# Patient Record
Sex: Male | Born: 1985 | Race: White | Hispanic: No | Marital: Married | Smoking: Never smoker
Health system: Southern US, Community
[De-identification: ages and names within clinical notes are randomized; demographics above are authoritative.]

## PROBLEM LIST (undated history)

## (undated) DIAGNOSIS — T7840XA Allergy, unspecified, initial encounter: Secondary | ICD-10-CM

## (undated) DIAGNOSIS — G4730 Sleep apnea, unspecified: Secondary | ICD-10-CM

## (undated) HISTORY — DX: Sleep apnea, unspecified: G47.30

## (undated) HISTORY — DX: Allergy, unspecified, initial encounter: T78.40XA

---

## 2009-08-24 ENCOUNTER — Ambulatory Visit: Payer: Self-pay | Admitting: Family Medicine

## 2013-09-26 ENCOUNTER — Ambulatory Visit: Payer: Self-pay | Admitting: Otolaryngology

## 2014-04-04 DIAGNOSIS — G4733 Obstructive sleep apnea (adult) (pediatric): Secondary | ICD-10-CM | POA: Insufficient documentation

## 2015-08-05 ENCOUNTER — Ambulatory Visit (INDEPENDENT_AMBULATORY_CARE_PROVIDER_SITE_OTHER): Payer: BLUE CROSS/BLUE SHIELD | Admitting: Family Medicine

## 2015-08-05 ENCOUNTER — Encounter: Payer: Self-pay | Admitting: Family Medicine

## 2015-08-05 VITALS — BP 120/70 | HR 88 | Temp 98.2°F | Ht 74.0 in | Wt 218.0 lb

## 2015-08-05 DIAGNOSIS — J0100 Acute maxillary sinusitis, unspecified: Secondary | ICD-10-CM | POA: Diagnosis not present

## 2015-08-05 MED ORDER — AMOXICILLIN 500 MG PO CAPS: 500.0000 mg | ORAL_CAPSULE | Freq: Three times a day (TID) | ORAL | Status: DC

## 2015-08-05 NOTE — Patient Instructions (Signed)

## 2015-08-05 NOTE — Progress Notes (Signed)
Name: Ralph Ashley   MRN: [id number]    DOB: 1986-01-04   Date:08/05/2015       Progress Note  Subjective  Chief Complaint  Chief Complaint  Patient presents with  . Sinusitis    cong, nasal drainage, sore throat    Sinusitis This is a new problem. The current episode started today. The problem has been gradually worsening since onset. The maximum temperature recorded prior to his arrival was 100.4 - 100.9 F. Associated symptoms include chills, congestion, coughing, diaphoresis, headaches, sinus pressure, sneezing and a sore throat. Pertinent negatives include no ear pain, neck pain, shortness of breath or swollen glands. Treatments tried: nasocort. The treatment provided mild relief.    No problem-specific assessment & plan notes found for this encounter.   Past Medical History  Diagnosis Date  . Allergy   . Sleep apnea     uses cpap    History reviewed. No pertinent past surgical history.  Family History  Problem Relation Age of Onset  . Cancer Paternal Grandfather   . Stroke Paternal Grandfather     Social History   Social History  . Marital Status: Married    Spouse Name: N/A  . Number of Children: N/A  . Years of Education: N/A   Occupational History  . Not on file.   Social History Main Topics  . Smoking status: Never Smoker   . Smokeless tobacco: Not on file  . Alcohol Use: 0.0 oz/week    0 Standard drinks or equivalent per week  . Drug Use: No  . Sexual Activity: Yes   Other Topics Concern  . Not on file   Social History Narrative  . No narrative on file    Allergies  Allergen Reactions  . Sulfa Antibiotics      Review of Systems  Constitutional: Positive for chills and diaphoresis. Negative for fever, weight loss and malaise/fatigue.  HENT: Positive for congestion, sinus pressure, sneezing and sore throat. Negative for ear discharge and ear pain.   Eyes: Negative for blurred vision.  Respiratory: Positive for cough. Negative for sputum  production, shortness of breath and wheezing.   Cardiovascular: Negative for chest pain, palpitations and leg swelling.  Gastrointestinal: Negative for heartburn, nausea, abdominal pain, diarrhea, constipation, blood in stool and melena.  Genitourinary: Negative for dysuria, urgency, frequency and hematuria.  Musculoskeletal: Negative for myalgias, back pain, joint pain and neck pain.  Skin: Negative for rash.  Neurological: Positive for headaches. Negative for dizziness, tingling, sensory change and focal weakness.  Endo/Heme/Allergies: Negative for environmental allergies and polydipsia. Does not bruise/bleed easily.  Psychiatric/Behavioral: Negative for depression and suicidal ideas. The patient is not nervous/anxious and does not have insomnia.      Objective  Filed Vitals:   08/05/15 1032  BP: 120/70  Pulse: 88  Temp: 98.2 F (36.8 C)  TempSrc: Oral  Height:  (1.88 m)  Weight: 218 lb (98.884 kg)    Physical Exam  Constitutional: He is oriented to person, place, and time and well-developed, well-nourished, and in no distress.  HENT:  Head: Normocephalic.  Right Ear: External ear normal.  Left Ear: External ear normal.  Nose: Nose normal.  Mouth/Throat: Oropharynx is clear and moist.  Eyes: Conjunctivae and EOM are normal. Pupils are equal, round, and reactive to light. Right eye exhibits no discharge. Left eye exhibits no discharge. No scleral icterus.  Neck: Normal range of motion. Neck supple. No JVD present. No tracheal deviation present. No thyromegaly present.  Cardiovascular:  Normal rate, regular rhythm, normal heart sounds and intact distal pulses.  Exam reveals no gallop and no friction rub.   No murmur heard. Pulmonary/Chest: Breath sounds normal. No respiratory distress. He has no wheezes. He has no rales.  Abdominal: Soft. Bowel sounds are normal. He exhibits no mass. There is no hepatosplenomegaly. There is no tenderness. There is no rebound, no guarding and  no CVA tenderness.  Musculoskeletal: Normal range of motion. He exhibits no edema or tenderness.  Lymphadenopathy:    He has no cervical adenopathy.  Neurological: He is alert and oriented to person, place, and time. He has normal sensation, normal strength and intact cranial nerves. No cranial nerve deficit.  Skin: Skin is warm. No rash noted.  Psychiatric: Mood and affect normal.      Assessment & Plan  Problem List Items Addressed This Visit    None    Visit Diagnoses    Acute maxillary sinusitis, recurrence not specified    -  Primary    Relevant Medications    Triamcinolone Acetonide (NASACORT ALLERGY 24HR NA)    amoxicillin (AMOXIL) 500 MG capsule         Dr. [facility]  08/05/2015

## 2015-09-29 ENCOUNTER — Ambulatory Visit (INDEPENDENT_AMBULATORY_CARE_PROVIDER_SITE_OTHER): Payer: BLUE CROSS/BLUE SHIELD | Admitting: Family Medicine

## 2015-09-29 ENCOUNTER — Encounter: Payer: Self-pay | Admitting: Family Medicine

## 2015-09-29 VITALS — BP 110/60 | HR 86 | Ht 74.0 in | Wt 219.0 lb

## 2015-09-29 DIAGNOSIS — Z0000 Encounter for general adult medical examination without abnormal findings: Secondary | ICD-10-CM | POA: Diagnosis not present

## 2015-09-29 DIAGNOSIS — Z1211 Encounter for screening for malignant neoplasm of colon: Secondary | ICD-10-CM

## 2015-09-29 LAB — POCT URINALYSIS DIPSTICK
BILIRUBIN UA: NEGATIVE
Blood, UA: NEGATIVE
CLARITY UA: NEGATIVE
Glucose, UA: NEGATIVE
KETONES UA: NEGATIVE
LEUKOCYTES UA: NEGATIVE
Nitrite, UA: NEGATIVE
Protein, UA: NEGATIVE
SPEC GRAV UA: 1.02
Urobilinogen, UA: 0.2
pH, UA: 6

## 2015-09-29 LAB — HEMOCCULT GUIAC POC 1CARD (OFFICE): Fecal Occult Blood, POC: NEGATIVE

## 2015-09-29 NOTE — Progress Notes (Signed)
Name: Ralph Ashley   MRN: [id number]    DOB: [birth year]   Date:09/29/2015       Progress Note  Subjective  Chief Complaint  Chief Complaint  Patient presents with  . Annual Exam    no concerns    HPI Comments: Patient presents for annual physical exam.   No problem-specific assessment & plan notes found for this encounter.   Past Medical History  Diagnosis Date  . Allergy   . Sleep apnea     uses cpap    History reviewed. No pertinent past surgical history.  Family History  Problem Relation Age of Onset  . Cancer Paternal Grandfather   . Stroke Paternal Grandfather     Social History   Social History  . Marital Status: Married    Spouse Name: N/A  . Number of Children: N/A  . Years of Education: N/A   Occupational History  . Not on file.   Social History Main Topics  . Smoking status: Never Smoker   . Smokeless tobacco: Not on file  . Alcohol Use: 0.0 oz/week    0 Standard drinks or equivalent per week  . Drug Use: No  . Sexual Activity: Yes   Other Topics Concern  . Not on file   Social History Narrative    Allergies  Allergen Reactions  . Sulfa Antibiotics      Review of Systems  Constitutional: Negative for fever, chills, weight loss and malaise/fatigue.  HENT: Negative for ear discharge, ear pain and sore throat.   Eyes: Negative for blurred vision.  Respiratory: Negative for cough, sputum production, shortness of breath and wheezing.   Cardiovascular: Negative for chest pain, palpitations and leg swelling.  Gastrointestinal: Negative for heartburn, nausea, abdominal pain, diarrhea, constipation, blood in stool and melena.  Genitourinary: Negative for dysuria, urgency, frequency and hematuria.  Musculoskeletal: Negative for myalgias, back pain, joint pain and neck pain.  Skin: Negative for rash.  Neurological: Negative for dizziness, tingling, sensory change, focal weakness and headaches.  Endo/Heme/Allergies: Negative for  environmental allergies and polydipsia. Does not bruise/bleed easily.  Psychiatric/Behavioral: Negative for depression and suicidal ideas. The patient is not nervous/anxious and does not have insomnia.      Objective  Filed Vitals:   09/29/15 0839  BP: 110/60  Pulse: 86  Height: 6\' 2"  (1.88 m)  Weight: 219 lb (99.338 kg)    Physical Exam  Constitutional: He is oriented to person, place, and time and well-developed, well-nourished, and in no distress.  HENT:  Head: Normocephalic.  Right Ear: Tympanic membrane, external ear and ear canal normal. No decreased hearing is noted.  Left Ear: Tympanic membrane, external ear and ear canal normal. No decreased hearing is noted.  Nose: Nose normal. No mucosal edema.  Mouth/Throat: Uvula is midline, oropharynx is clear and moist and mucous membranes are normal.  Eyes: Conjunctivae and EOM are normal. Pupils are equal, round, and reactive to light. Right eye exhibits no discharge. Left eye exhibits no discharge. No scleral icterus.  Neck: Normal range of motion. Neck supple. No JVD present. No tracheal deviation present. No thyromegaly present.  Cardiovascular: Normal rate, regular rhythm, normal heart sounds and intact distal pulses.  Exam reveals no gallop and no friction rub.   No murmur heard. Pulmonary/Chest: Breath sounds normal. No respiratory distress. He has no wheezes. He has no rales.  Abdominal: Soft. Bowel sounds are normal. He exhibits no mass. There is no hepatosplenomegaly. There is no tenderness. There is no rebound,  no guarding and no CVA tenderness.  Genitourinary: Rectum normal, prostate normal, testes/scrotum normal and penis normal. Guaiac negative stool.  Musculoskeletal: Normal range of motion. He exhibits no edema or tenderness.  Lymphadenopathy:    He has no cervical adenopathy.  Neurological: He is alert and oriented to person, place, and time. He has normal sensation, normal strength, normal reflexes and intact cranial  nerves. No cranial nerve deficit.  Skin: Skin is warm and dry. No rash noted.  Psychiatric: Mood and affect normal.  Nursing note and vitals reviewed.     Assessment & Plan  Problem List Items Addressed This Visit      Other   Annual physical exam - Primary   Relevant Orders   POCT Occult Blood Stool (Completed)   POCT urinalysis dipstick (Completed)   Renal Function Panel   Lipid Profile    Other Visit Diagnoses    Colon cancer screening             Dr. Elizabeth Sauer [facility]  09/29/2015

## 2015-09-30 LAB — RENAL FUNCTION PANEL
ALBUMIN: 4.6 g/dL (ref 3.5–5.5)
BUN/Creatinine Ratio: 13 (ref 9–20)
BUN: 12 mg/dL (ref 6–20)
CALCIUM: 9.5 mg/dL (ref 8.7–10.2)
CHLORIDE: 103 mmol/L (ref 96–106)
CO2: 24 mmol/L (ref 18–29)
Creatinine, Ser: 0.95 mg/dL (ref 0.76–1.27)
GFR calc Af Amer: 123 mL/min/{1.73_m2} (ref >59)
GFR calc non Af Amer: 106 mL/min/{1.73_m2} (ref >59)
GLUCOSE: 81 mg/dL (ref 65–99)
PHOSPHORUS: 3.2 mg/dL (ref 2.5–4.5)
POTASSIUM: 4.3 mmol/L (ref 3.5–5.2)
SODIUM: 146 mmol/L — AB (ref 134–144)

## 2015-09-30 LAB — LIPID PANEL
CHOLESTEROL TOTAL: 166 mg/dL (ref 100–199)
Chol/HDL Ratio: 4 ratio units (ref 0.0–5.0)
HDL: 41 mg/dL (ref >39)
LDL Calculated: 92 mg/dL (ref 0–99)
TRIGLYCERIDES: 163 mg/dL — AB (ref 0–149)
VLDL Cholesterol Cal: 33 mg/dL (ref 5–40)

## 2016-06-06 DIAGNOSIS — G4733 Obstructive sleep apnea (adult) (pediatric): Secondary | ICD-10-CM | POA: Diagnosis not present

## 2016-10-28 ENCOUNTER — Ambulatory Visit (INDEPENDENT_AMBULATORY_CARE_PROVIDER_SITE_OTHER): Payer: BLUE CROSS/BLUE SHIELD | Admitting: Family Medicine

## 2016-10-28 ENCOUNTER — Encounter: Payer: Self-pay | Admitting: Family Medicine

## 2016-10-28 VITALS — BP 100/64 | HR 72 | Ht 74.0 in | Wt 224.0 lb

## 2016-10-28 DIAGNOSIS — Z0000 Encounter for general adult medical examination without abnormal findings: Secondary | ICD-10-CM | POA: Diagnosis not present

## 2016-10-28 DIAGNOSIS — Z23 Encounter for immunization: Secondary | ICD-10-CM | POA: Diagnosis not present

## 2016-10-28 NOTE — Progress Notes (Signed)
Name: Ralph Ashley   MRN: [id number]    DOB: Apr 27, 1986   Date:10/28/2016       Progress Note  Subjective  Chief Complaint  Chief Complaint  Patient presents with  . Annual Exam    no issues or concerns    Patient presents annual physical exam.      No problem-specific Assessment & Plan notes found for this encounter.   Past Medical History:  Diagnosis Date  . Allergy   . Sleep apnea    uses cpap    No past surgical history on file.  Family History  Problem Relation Age of Onset  . Cancer Paternal Grandfather   . Stroke Paternal Grandfather     Social History   Social History  . Marital status: Married    Spouse name: N/A  . Number of children: N/A  . Years of education: N/A   Occupational History  . Not on file.   Social History Main Topics  . Smoking status: Never Smoker  . Smokeless tobacco: Never Used  . Alcohol use 0.0 oz/week  . Drug use: No  . Sexual activity: Yes   Other Topics Concern  . Not on file   Social History Narrative  . No narrative on file    Allergies  Allergen Reactions  . Sulfa Antibiotics     Outpatient Medications Prior to Visit  Medication Sig Dispense Refill  . Triamcinolone Acetonide (NASACORT ALLERGY 24HR NA) Place 1 spray into the nose daily.     No facility-administered medications prior to visit.     Review of Systems  Constitutional: Negative for chills, fever, malaise/fatigue and weight loss.  HENT: Negative for ear discharge, ear pain and sore throat.   Eyes: Negative for blurred vision.  Respiratory: Negative for cough, sputum production, shortness of breath and wheezing.   Cardiovascular: Negative for chest pain, palpitations and leg swelling.  Gastrointestinal: Negative for abdominal pain, blood in stool, constipation, diarrhea, heartburn, melena and nausea.  Genitourinary: Negative for dysuria, frequency, hematuria and urgency.  Musculoskeletal: Negative for back pain, joint pain, myalgias and neck  pain.  Skin: Negative for rash.  Neurological: Negative for dizziness, tingling, sensory change, focal weakness and headaches.  Endo/Heme/Allergies: Negative for environmental allergies and polydipsia. Does not bruise/bleed easily.  Psychiatric/Behavioral: Negative for depression and suicidal ideas. The patient is not nervous/anxious and does not have insomnia.      Objective  Vitals:   10/28/16 0833  BP: 100/64  Pulse: 72  Weight: 224 lb (101.6 kg)  Height: 6\' 2"  (1.88 m)    Physical Exam  Constitutional: He is oriented to person, place, and time and well-developed, well-nourished, and in no distress. Vital signs are normal.  HENT:  Head: Normocephalic.  Right Ear: Tympanic membrane, external ear and ear canal normal.  Left Ear: Tympanic membrane, external ear and ear canal normal.  Nose: Nose normal.  Mouth/Throat: Uvula is midline, oropharynx is clear and moist and mucous membranes are normal.  Eyes: Conjunctivae, EOM and lids are normal. Pupils are equal, round, and reactive to light. Right eye exhibits no discharge. Left eye exhibits no discharge. No scleral icterus.  Fundoscopic exam:      The right eye shows no arteriolar narrowing, no hemorrhage and no papilledema.       The left eye shows no arteriolar narrowing, no hemorrhage and no papilledema.  Neck: Trachea normal and normal range of motion. Neck supple. Normal carotid pulses, no hepatojugular reflux and no JVD present. Carotid  bruit is not present. No tracheal deviation present. No thyromegaly present.  Cardiovascular: Normal rate, regular rhythm, S1 normal, S2 normal, normal heart sounds, intact distal pulses and normal pulses.  PMI is not displaced.  Exam reveals no gallop, no S3, no S4 and no friction rub.   No murmur heard. Pulmonary/Chest: Breath sounds normal. No respiratory distress. He has no decreased breath sounds. He has no wheezes. He has no rhonchi. He has no rales. He exhibits no mass. Right breast  exhibits no mass. Left breast exhibits no mass.  Abdominal: Soft. Normal aorta and bowel sounds are normal. He exhibits no mass. There is no hepatosplenomegaly. There is no tenderness. There is no rebound, no guarding and no CVA tenderness.  Genitourinary: Rectum normal, prostate normal, testes/scrotum normal and penis normal. Rectal exam shows guaiac negative stool.  Musculoskeletal: Normal range of motion. He exhibits no edema or tenderness.       Cervical back: Normal.       Thoracic back: Normal.       Lumbar back: Normal.  Lymphadenopathy:       Head (right side): No submandibular adenopathy present.       Head (left side): No submandibular adenopathy present.    He has no cervical adenopathy.    He has no axillary adenopathy.  Neurological: He is alert and oriented to person, place, and time. He has normal sensation, normal strength, normal reflexes and intact cranial nerves. No cranial nerve deficit.  Skin: Skin is warm, dry and intact. No rash noted.  Psychiatric: Mood, memory, affect and judgment normal.  Nursing note and vitals reviewed.     Assessment & Plan  Problem List Items Addressed This Visit      Other   Annual physical exam - Primary   Relevant Orders   Lipid Profile   Renal function panel    Other Visit Diagnoses    Need for diphtheria-tetanus-pertussis (Tdap) vaccine          No orders of the defined types were placed in this encounter.     Dr. [facility]  10/28/16

## 2016-10-29 LAB — RENAL FUNCTION PANEL
ALBUMIN: 4.5 g/dL (ref 3.5–5.5)
BUN/Creatinine Ratio: 12 (ref 9–20)
BUN: 11 mg/dL (ref 6–20)
CO2: 26 mmol/L (ref 18–29)
CREATININE: 0.93 mg/dL (ref 0.76–1.27)
Calcium: 9.5 mg/dL (ref 8.7–10.2)
Chloride: 104 mmol/L (ref 96–106)
GFR, EST AFRICAN AMERICAN: 125 mL/min/{1.73_m2} (ref >59)
GFR, EST NON AFRICAN AMERICAN: 108 mL/min/{1.73_m2} (ref >59)
GLUCOSE: 91 mg/dL (ref 65–99)
Phosphorus: 3.3 mg/dL (ref 2.5–4.5)
Potassium: 4.9 mmol/L (ref 3.5–5.2)
Sodium: 144 mmol/L (ref 134–144)

## 2016-10-29 LAB — LIPID PANEL
CHOLESTEROL TOTAL: 158 mg/dL (ref 100–199)
Chol/HDL Ratio: 4 ratio (ref 0.0–5.0)
HDL: 40 mg/dL (ref >39)
LDL Calculated: 92 mg/dL (ref 0–99)
TRIGLYCERIDES: 132 mg/dL (ref 0–149)
VLDL CHOLESTEROL CAL: 26 mg/dL (ref 5–40)

## 2017-10-30 ENCOUNTER — Encounter: Payer: Self-pay | Admitting: Family Medicine

## 2017-10-30 ENCOUNTER — Ambulatory Visit (INDEPENDENT_AMBULATORY_CARE_PROVIDER_SITE_OTHER): Payer: BLUE CROSS/BLUE SHIELD | Admitting: Family Medicine

## 2017-10-30 VITALS — BP 100/80 | HR 88 | Ht 74.0 in | Wt 221.0 lb

## 2017-10-30 DIAGNOSIS — Z0000 Encounter for general adult medical examination without abnormal findings: Secondary | ICD-10-CM

## 2017-10-30 NOTE — Progress Notes (Signed)
Name: Ralph Ashley   MRN: [id number]    DOB: December 26, 1985   Date:10/30/2017       Progress Note  Subjective  Chief Complaint  Chief Complaint  Patient presents with  . Annual Exam    no issues    Patient presents for annual physical exam.   No problem-specific Assessment & Plan notes found for this encounter.   Past Medical History:  Diagnosis Date  . Allergy   . Sleep apnea    uses cpap    History reviewed. No pertinent surgical history.  Family History  Problem Relation Age of Onset  . Cancer Paternal Grandfather   . Stroke Paternal Grandfather     Social History   Socioeconomic History  . Marital status: Married    Spouse name: Not on file  . Number of children: Not on file  . Years of education: Not on file  . Highest education level: Not on file  Occupational History  . Not on file  Social Needs  . Financial resource strain: Not on file  . Food insecurity:    Worry: Not on file    Inability: Not on file  . Transportation needs:    Medical: Not on file    Non-medical: Not on file  Tobacco Use  . Smoking status: Never Smoker  . Smokeless tobacco: Never Used  Substance and Sexual Activity  . Alcohol use: Yes    Alcohol/week: 0.0 oz  . Drug use: No  . Sexual activity: Yes  Lifestyle  . Physical activity:    Days per week: Not on file    Minutes per session: Not on file  . Stress: Not on file  Relationships  . Social connections:    Talks on phone: Not on file    Gets together: Not on file    Attends religious service: Not on file    Active member of club or organization: Not on file    Attends meetings of clubs or organizations: Not on file    Relationship status: Not on file  . Intimate partner violence:    Fear of current or ex partner: Not on file    Emotionally abused: Not on file    Physically abused: Not on file    Forced sexual activity: Not on file  Other Topics Concern  . Not on file  Social History Narrative  . Not on file     Allergies  Allergen Reactions  . Sulfa Antibiotics     Outpatient Medications Prior to Visit  Medication Sig Dispense Refill  . Triamcinolone Acetonide (NASACORT ALLERGY 24HR NA) Place 1 spray into the nose daily.     No facility-administered medications prior to visit.     Review of Systems  Constitutional: Negative for chills, fever, malaise/fatigue and weight loss.  HENT: Negative for ear discharge, ear pain and sore throat.   Eyes: Negative for blurred vision.  Respiratory: Negative for cough, sputum production, shortness of breath and wheezing.   Cardiovascular: Negative for chest pain, palpitations and leg swelling.  Gastrointestinal: Negative for abdominal pain, blood in stool, constipation, diarrhea, heartburn, melena and nausea.  Genitourinary: Negative for dysuria, frequency, hematuria and urgency.  Musculoskeletal: Negative for back pain, joint pain, myalgias and neck pain.  Skin: Negative for rash.  Neurological: Negative for dizziness, tingling, sensory change, focal weakness and headaches.  Endo/Heme/Allergies: Negative for environmental allergies and polydipsia. Does not bruise/bleed easily.  Psychiatric/Behavioral: Negative for depression and suicidal ideas. The patient is not nervous/anxious  and does not have insomnia.      Objective  Vitals:   10/30/17 0840  BP: 100/80  Pulse: 88  Weight: 221 lb (100.2 kg)  Height:  (1.88 m)    Physical Exam  Constitutional: He is oriented to person, place, and time. Vital signs are normal. He appears well-developed and well-nourished.  HENT:  Head: Normocephalic.  Right Ear: Hearing, tympanic membrane, external ear and ear canal normal. No decreased hearing is noted.  Left Ear: Hearing, tympanic membrane, external ear and ear canal normal. No decreased hearing is noted.  Nose: Nose normal. No mucosal edema or nasal deformity.  Mouth/Throat: Uvula is midline, oropharynx is clear and moist and mucous membranes  are normal. No oropharyngeal exudate, posterior oropharyngeal edema or posterior oropharyngeal erythema.  Eyes: Pupils are equal, round, and reactive to light. Conjunctivae, EOM and lids are normal. Right eye exhibits no discharge. Left eye exhibits no discharge. No scleral icterus.  Fundoscopic exam:      The right eye shows no arteriolar narrowing, no AV nicking, no exudate and no papilledema.       The left eye shows no arteriolar narrowing, no AV nicking, no exudate and no papilledema.  Neck: Trachea normal, normal range of motion and full passive range of motion without pain. Neck supple. Normal carotid pulses and no JVD present. Carotid bruit is not present. No tracheal deviation present. No thyroid mass and no thyromegaly present.  Cardiovascular: Normal rate, regular rhythm, S1 normal, S2 normal, normal heart sounds and intact distal pulses. PMI is not displaced. Exam reveals no gallop, no S3, no S4 and no friction rub.  No murmur heard. Pulses:      Carotid pulses are 2+ on the right side, and 2+ on the left side.      Radial pulses are 2+ on the right side, and 2+ on the left side.       Femoral pulses are 2+ on the right side, and 2+ on the left side.      Popliteal pulses are 2+ on the right side, and 2+ on the left side.       Dorsalis pedis pulses are 2+ on the right side, and 2+ on the left side.       Posterior tibial pulses are 2+ on the right side, and 2+ on the left side.  Pulmonary/Chest: Effort normal and breath sounds normal. No respiratory distress. He has no wheezes. He has no rales. Right breast exhibits no mass.  Abdominal: Soft. Normal aorta and bowel sounds are normal. He exhibits no mass. There is no hepatosplenomegaly. There is no tenderness. There is no rigidity, no rebound, no guarding and no CVA tenderness. No hernia. Hernia confirmed negative in the right inguinal area and confirmed negative in the left inguinal area.  Genitourinary: Testes normal and penis normal.  Circumcised.  Musculoskeletal: Normal range of motion. He exhibits no edema or tenderness.       Cervical back: Normal.       Thoracic back: Normal.       Lumbar back: Normal.  Lymphadenopathy:       Head (right side): No submental and no submandibular adenopathy present.       Head (left side): No submental and no submandibular adenopathy present.    He has no cervical adenopathy.    He has no axillary adenopathy. No inguinal adenopathy noted on the right or left side.  Neurological: He is alert and oriented to person, place, and  time. He has normal strength and normal reflexes. No cranial nerve deficit or sensory deficit.  Reflex Scores:      Tricep reflexes are 2+ on the right side and 2+ on the left side.      Bicep reflexes are 2+ on the right side and 2+ on the left side.      Brachioradialis reflexes are 2+ on the right side and 2+ on the left side.      Patellar reflexes are 2+ on the right side and 2+ on the left side.      Achilles reflexes are 2+ on the right side and 2+ on the left side. Skin: Skin is warm and intact. No rash noted.     birthmark  Vitals reviewed.     Assessment & Plan  Problem List Items Addressed This Visit      Other   Annual physical exam - Primary   Relevant Orders   Lipid panel   Renal Function Panel      No orders of the defined types were placed in this encounter. Ralph Ashley is a 32 y.o. male who presents today for his Complete Annual Exam. He feels well. He reports exercising . He reports he is sleeping well.  Immunizations are reviewed and recommendations provided.   Age appropriate screening tests are discussed. Counseling given for risk factor reduction interventions. Health risks of being over weight were discussed and patient was counseled on weight loss options and exercise.  Dr. [facility]den [facility]  10/30/17

## 2017-10-31 LAB — RENAL FUNCTION PANEL
ALBUMIN: 4.4 g/dL (ref 3.5–5.5)
BUN/Creatinine Ratio: 10 (ref 9–20)
BUN: 9 mg/dL (ref 6–20)
CALCIUM: 9.4 mg/dL (ref 8.7–10.2)
CHLORIDE: 107 mmol/L — AB (ref 96–106)
CO2: 25 mmol/L (ref 20–29)
Creatinine, Ser: 0.87 mg/dL (ref 0.76–1.27)
GFR calc Af Amer: 131 mL/min/{1.73_m2} (ref >59)
GFR calc non Af Amer: 113 mL/min/{1.73_m2} (ref >59)
GLUCOSE: 85 mg/dL (ref 65–99)
PHOSPHORUS: 3.3 mg/dL (ref 2.5–4.5)
POTASSIUM: 4 mmol/L (ref 3.5–5.2)
SODIUM: 147 mmol/L — AB (ref 134–144)

## 2017-10-31 LAB — LIPID PANEL
Chol/HDL Ratio: 3.4 ratio (ref 0.0–5.0)
Cholesterol, Total: 134 mg/dL (ref 100–199)
HDL: 39 mg/dL — ABNORMAL LOW (ref >39)
LDL Calculated: 73 mg/dL (ref 0–99)
Triglycerides: 111 mg/dL (ref 0–149)
VLDL Cholesterol Cal: 22 mg/dL (ref 5–40)

## 2018-09-21 ENCOUNTER — Telehealth: Payer: Self-pay

## 2018-09-21 DIAGNOSIS — Z792 Long term (current) use of antibiotics: Secondary | ICD-10-CM

## 2018-09-21 MED ORDER — OSELTAMIVIR PHOSPHATE 75 MG PO CAPS: 75.0000 mg | ORAL_CAPSULE | Freq: Every day | ORAL | 0 refills | Status: DC

## 2018-09-21 NOTE — Telephone Encounter (Signed)
Pt called stating wife was Dx with flu today and was told her needed tamiflu called in. Sent in Tamiflu 75mg  1 qday x 10 days to [facility].

## 2018-11-01 ENCOUNTER — Ambulatory Visit (INDEPENDENT_AMBULATORY_CARE_PROVIDER_SITE_OTHER): Payer: BLUE CROSS/BLUE SHIELD | Admitting: Family Medicine

## 2018-11-01 ENCOUNTER — Encounter: Payer: Self-pay | Admitting: Family Medicine

## 2018-11-01 ENCOUNTER — Other Ambulatory Visit: Payer: Self-pay

## 2018-11-01 VITALS — BP 102/70 | HR 80 | Ht 74.0 in | Wt 222.0 lb

## 2018-11-01 DIAGNOSIS — L649 Androgenic alopecia, unspecified: Secondary | ICD-10-CM | POA: Diagnosis not present

## 2018-11-01 DIAGNOSIS — Z0000 Encounter for general adult medical examination without abnormal findings: Secondary | ICD-10-CM

## 2018-11-01 DIAGNOSIS — S61209S Unspecified open wound of unspecified finger without damage to nail, sequela: Secondary | ICD-10-CM

## 2018-11-01 MED ORDER — FINASTERIDE 1 MG PO TABS: 1.0000 mg | ORAL_TABLET | Freq: Every day | ORAL | 11 refills | Status: DC

## 2018-11-01 NOTE — Progress Notes (Signed)
Date:  11/01/2018   Name:  Ralph Ashley   DOB:  [birth year]   MRN:  [id number]   Chief Complaint: Annual Exam and Hand Pain (feels like it is weak and the index finger will not go all the way down)  Patient is a 33 year old male who presents for a comprehensive physical exam. The patient reports the following problems: unable to flex right index. Health maintenance has been reviewed patient up to date.  Hand Pain   The incident occurred more than 1 week ago (couple years). There was no injury mechanism. The pain is present in the right fingers (index). The quality of the pain is described as aching. The pain is at a severity of 0/10. The patient is experiencing no pain. Associated symptoms include muscle weakness. Pertinent negatives include no chest pain, numbness or tingling. The symptoms are aggravated by movement (can't pull trigger). The treatment provided mild relief.    Review of Systems  Constitutional: Negative for chills and fever.  HENT: Negative for drooling, ear discharge, ear pain and sore throat.   Respiratory: Negative for cough, shortness of breath and wheezing.   Cardiovascular: Negative for chest pain, palpitations and leg swelling.  Gastrointestinal: Negative for abdominal pain, blood in stool, constipation, diarrhea and nausea.  Endocrine: Negative for polydipsia.  Genitourinary: Negative for dysuria, frequency, hematuria and urgency.  Musculoskeletal: Positive for arthralgias. Negative for back pain, gait problem, joint swelling, myalgias, neck pain and neck stiffness.  Skin: Negative for rash.  Allergic/Immunologic: Negative for environmental allergies.  Neurological: Negative for dizziness, tingling, numbness and headaches.  Hematological: Does not bruise/bleed easily.  Psychiatric/Behavioral: Negative for suicidal ideas. The patient is not nervous/anxious.     Patient Active Problem List   Diagnosis Date Noted  . Annual physical exam 09/29/2015     Allergies  Allergen Reactions  . Sulfa Antibiotics     No past surgical history on file.  Social History   Tobacco Use  . Smoking status: Never Smoker  . Smokeless tobacco: Never Used  Substance Use Topics  . Alcohol use: Yes    Alcohol/week: 0.0 standard drinks  . Drug use: No     Medication list has been reviewed and updated.  Current Meds  Medication Sig  . Triamcinolone Acetonide (NASACORT ALLERGY 24HR NA) Place 1 spray into the nose daily.    PHQ 2/9 Scores 10/30/2017 09/29/2015 08/05/2015  PHQ - 2 Score 0 0 0  PHQ- 9 Score 0 - -    BP Readings from Last 3 Encounters:  11/01/18 102/70  10/30/17 100/80  10/28/16 100/64    Physical Exam Vitals signs and nursing note reviewed.  Constitutional:      Appearance: Normal appearance. He is well-developed and overweight.  HENT:     Head: Normocephalic. No right periorbital erythema or left periorbital erythema.     Jaw: There is normal jaw occlusion.     Salivary Glands: Right salivary gland is not diffusely enlarged. Left salivary gland is not diffusely enlarged.     Right Ear: External ear normal.     Left Ear: External ear normal.     Nose: Nose normal.  Eyes:     General: Lids are normal. Vision grossly intact. Gaze aligned appropriately. No allergic shiner or scleral icterus.       Right eye: No discharge.        Left eye: No discharge.     Extraocular Movements: Extraocular movements intact.  Conjunctiva/sclera: Conjunctivae normal.     Pupils: Pupils are equal, round, and reactive to light.     Funduscopic exam:    Right eye: No AV nicking.        Left eye: No AV nicking.     Comments: Mild left ptosis  Neck:     Musculoskeletal: Full passive range of motion without pain, normal range of motion and neck supple.     Thyroid: No thyroid mass or thyromegaly.     Vascular: No carotid bruit, hepatojugular reflux or JVD.     Trachea: Trachea normal. No tracheal deviation.  Cardiovascular:     Rate and  Rhythm: Normal rate and regular rhythm.  No extrasystoles are present.    Chest Wall: PMI is not displaced. No thrill.     Pulses: No decreased pulses.          Carotid pulses are 2+ on the right side and 2+ on the left side.      Radial pulses are 2+ on the right side and 2+ on the left side.       Femoral pulses are 2+ on the right side and 2+ on the left side.      Popliteal pulses are 2+ on the right side and 2+ on the left side.       Dorsalis pedis pulses are 2+ on the right side and 2+ on the left side.       Posterior tibial pulses are 2+ on the right side and 2+ on the left side.     Heart sounds: Normal heart sounds, S1 normal and S2 normal. No murmur. No systolic murmur. No diastolic murmur. No friction rub. No gallop. No S3 or S4 sounds.   Pulmonary:     Effort: Pulmonary effort is normal. No respiratory distress.     Breath sounds: Normal breath sounds. No decreased air movement. No decreased breath sounds, wheezing or rales.  Chest:     Chest wall: No mass.     Breasts:        Right: Normal. No mass.        Left: Normal. No mass.  Abdominal:     General: Abdomen is flat. Bowel sounds are normal.     Palpations: Abdomen is soft. There is no hepatomegaly, splenomegaly or mass.     Tenderness: There is no abdominal tenderness. There is no guarding or rebound.     Hernia: There is no hernia in the right inguinal area or left inguinal area.  Genitourinary:    Penis: Normal and circumcised.      Scrotum/Testes: Normal.        Right: Mass, testicular hydrocele or varicocele not present.        Left: Mass, testicular hydrocele or varicocele not present.     Prostate: Normal. Not enlarged and no nodules present.     Rectum: Normal. Guaiac result negative. No mass.  Musculoskeletal:        General: No tenderness.     Right hand: He exhibits decreased range of motion. He exhibits no tenderness, no bony tenderness, normal capillary refill and no deformity. Decreased strength noted.  He exhibits thumb/finger opposition.     Right lower leg: No edema.     Left lower leg: No edema.  Lymphadenopathy:     Cervical: No cervical adenopathy.     Right cervical: No superficial, deep or posterior cervical adenopathy.    Left cervical: No superficial, deep or posterior cervical adenopathy.  Upper Body:     Right upper body: No supraclavicular or axillary adenopathy.     Left upper body: No supraclavicular or axillary adenopathy.  Skin:    General: Skin is warm.     Findings: No rash.     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     Mental Status: He is alert and oriented to person, place, and time.     Cranial Nerves: Cranial nerves are intact. No cranial nerve deficit.     Sensory: Sensation is intact.     Motor: Motor function is intact.     Deep Tendon Reflexes: Reflexes are normal and symmetric. Reflexes normal.     Reflex Scores:      Tricep reflexes are 2+ on the right side and 2+ on the left side.      Bicep reflexes are 2+ on the right side and 2+ on the left side.      Brachioradialis reflexes are 2+ on the right side and 2+ on the left side.      Patellar reflexes are 2+ on the right side and 2+ on the left side.      Achilles reflexes are 2+ on the right side and 2+ on the left side. Psychiatric:        Behavior: Behavior is cooperative.     Wt Readings from Last 3 Encounters:  11/01/18 222 lb (100.7 kg)  10/30/17 221 lb (100.2 kg)  10/28/16 224 lb (101.6 kg)    BP 102/70   Pulse 80   Ht 6\' 2"  (1.88 m)   Wt 222 lb (100.7 kg)   BMI 28.50 kg/m   Assessment and Plan:  1. Annual physical exam No subjective/objective concerns noted on history and physical exam.  Patient's chart was reviewed for previous visits, labs, and imaging,.  Will obtain a renal function panel and lipid panel. 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  Information on finasteride was given and patient was given a written printed prescription for finasteride 1 mg once a day.  Patient may start this and will determine if to continue. - finasteride (PROPECIA) 1 MG tablet; Take 1 tablet (1 mg total) by mouth daily.  Dispense: 30 tablet; Refill: 11  3. Avulsion, finger tip, sequela Patient's been unable to make a fist and with his right index finger.  Patient also has no opponents strength in that area as well there is been no history of injury but he did play football and there is some question of whether or not he could have had a remote injury.  Patient is referred to orthopedic surgery for evaluation although I do not think there is anything that can be done at this time at least we can have an explanation and may be verification. - Ambulatory referral to Orthopedic Surgery

## 2018-11-01 NOTE — Patient Instructions (Addendum)
Finasteride (Propecia) tablets What is this medicine? FINASTERIDE (fi NAS teer ide) is used to treat male pattern baldness in men only. This medicine is not for use in women. This medicine may be used for other purposes; ask your health care provider or pharmacist if you have questions. COMMON BRAND NAME(S): Propecia What should I tell my health care provider before I take this medicine? They need to know if you have any of these conditions: -liver disease -an unusual or allergic reaction to finasteride, other medicines, foods, dyes, or preservatives -pregnant or trying to get pregnant -breast-feeding How should I use this medicine? Take this medicine by mouth with a glass of water. Follow the directions on the prescription label. You can take this medicine with or without food. Take your doses at regular intervals. Do not take your medicine more often than directed. Talk to your pediatrician regarding the use of this medicine in children. Special care may be needed. Overdosage: If you think you have taken too much of this medicine contact a poison control center or emergency room at once. NOTE: This medicine is only for you. Do not share this medicine with others. What if I miss a dose? If you miss a dose, take it as soon as you can. If it is almost time for your next dose, take only that dose. Do not take double or extra doses. What may interact with this medicine? -saw palmetto or other dietary supplements This list may not describe all possible interactions. Give your health care provider a list of all the medicines, herbs, non-prescription drugs, or dietary supplements you use. Also tell them if you smoke, drink alcohol, or use illegal drugs. Some items may interact with your medicine. What should I watch for while using this medicine? It may take at least three months of daily use of this medicine before you notice an improvement in you hair loss. You must continue to take this medicine  to maintain the results. If you stop taking this medicine, the effect will be reversed within 12 months. Do not donate blood while you are taking this medicine. This will prevent giving this medicine to a pregnant male through a blood transfusion. Ask your doctor or health care professional when it is safe to donate blood after you stop taking this medicine. Women who are pregnant or may get pregnant must not handle broken or crushed finasteride tablets. The active ingredient could harm the unborn baby. If a pregnant woman comes into contact with broken or crushed tablets she should check with her doctor or health care professional. Exposure to whole tablets is not expected to cause harm as long as they are not swallowed. This medicine can interfere with PSA laboratory tests for prostate cancer. If you are scheduled to have a lab test for prostate cancer, tell your doctor or health care professional that you are taking this medicine. This medicine may increase your risk of getting some cancers, like breast cancer. Talk with your doctor.  Mediterranean Diet A Mediterranean diet refers to food and lifestyle choices that are based on the traditions of countries located on the [employer] [place]. This way of eating has been shown to help prevent certain conditions and improve outcomes for people who have chronic diseases, like kidney disease and heart disease. What are tips for following this plan? Lifestyle  Cook and eat meals together with your family, when possible.  Drink enough fluid to keep your urine clear or pale yellow.  Be physically active every  day. This includes: ? Aerobic exercise like running or swimming. ? Leisure activities like gardening, walking, or housework.  Get 7-8 hours of sleep each night.  If recommended by your health care provider, drink red wine in moderation. This means 1 glass a day for nonpregnant women and 2 glasses a day for men. A glass of wine equals 5 oz (150  mL). Reading food labels   Check the serving size of packaged foods. For foods such as rice and pasta, the serving size refers to the amount of cooked product, not dry.  Check the total fat in packaged foods. Avoid foods that have saturated fat or trans fats.  Check the ingredients list for added sugars, such as corn syrup. Shopping  At the grocery store, buy most of your food from the areas near the walls of the store. This includes: ? Fresh fruits and vegetables (produce). ? Grains, beans, nuts, and seeds. Some of these may be available in unpackaged forms or large amounts (in bulk). ? Fresh seafood. ? Poultry and eggs. ? Low-fat dairy products.  Buy whole ingredients instead of prepackaged foods.  Buy fresh fruits and vegetables in-season from local farmers markets.  Buy frozen fruits and vegetables in resealable bags.  If you do not have access to quality fresh seafood, buy precooked frozen shrimp or canned fish, such as tuna, salmon, or sardines.  Buy small amounts of raw or cooked vegetables, salads, or olives from the deli or salad bar at your store.  Stock your pantry so you always have certain foods on hand, such as olive oil, canned tuna, canned tomatoes, rice, pasta, and beans. Cooking  Cook foods with extra-virgin olive oil instead of using butter or other vegetable oils.  Have meat as a side dish, and have vegetables or grains as your main dish. This means having meat in small portions or adding small amounts of meat to foods like pasta or stew.  Use beans or vegetables instead of meat in common dishes like chili or lasagna.  Experiment with different cooking methods. Try roasting or broiling vegetables instead of steaming or sauteing them.  Add frozen vegetables to soups, stews, pasta, or rice.  Add nuts or seeds for added healthy fat at each meal. You can add these to yogurt, salads, or vegetable dishes.  Marinate fish or vegetables using olive oil, lemon  juice, garlic, and fresh herbs. Meal planning   Plan to eat 1 vegetarian meal one day each week. Try to work up to 2 vegetarian meals, if possible.  Eat seafood 2 or more times a week.  Have healthy snacks readily available, such as: ? Vegetable sticks with hummus. ? [place] yogurt. ? Fruit and nut trail mix.  Eat balanced meals throughout the week. This includes: ? Fruit: 2-3 servings a day ? Vegetables: 4-5 servings a day ? Low-fat dairy: 2 servings a day ? Fish, poultry, or lean meat: 1 serving a day ? Beans and legumes: 2 or more servings a week ? Nuts and seeds: 1-2 servings a day ? Whole grains: 6-8 servings a day ? Extra-virgin olive oil: 3-4 servings a day  Limit red meat and sweets to only a few servings a month What are my food choices?  Mediterranean diet ? Recommended ? Grains: Whole-grain pasta. Brown rice. Bulgar wheat. Polenta. Couscous. Whole-wheat bread. Orpah Cobb. ? Vegetables: Artichokes. Beets. Broccoli. Cabbage. Carrots. Eggplant. Green beans. Chard. Kale. Spinach. Onions. Leeks. Peas. Squash. Tomatoes. Peppers. Radishes. ? Fruits: Apples. Apricots. Avocado. Berries.  Bananas. Cherries. Dates. Figs. Grapes. Lemons. Melon. Oranges. Peaches. Plums. Pomegranate. ? Meats and other protein foods: Beans. Almonds. Sunflower seeds. Pine nuts. Peanuts. Cod. Salmon. Scallops. Shrimp. Tuna. Tilapia. Clams. Oysters. Eggs. ? Dairy: Low-fat milk. Cheese. Greek yogurt. ? Beverages: Water. Red wine. Herbal tea. ? Fats and oils: Extra virgin olive oil. Avocado oil. Grape seed oil. ? Sweets and desserts: [place] yogurt with honey. Baked apples. Poached pears. Trail mix. ? Seasoning and other foods: Basil. Cilantro. Coriander. Cumin. Mint. Parsley. Sage. Rosemary. Tarragon. Garlic. Oregano. Thyme. Pepper. Balsalmic vinegar. Tahini. Hummus. Tomato sauce. Olives. Mushrooms. ? Limit these ? Grains: Prepackaged pasta or rice dishes. Prepackaged cereal with added  sugar. ? Vegetables: Deep fried potatoes (french fries). ? Fruits: Fruit canned in syrup. ? Meats and other protein foods: Beef. Pork. Lamb. Poultry with skin. Hot dogs. Tomasa BlaseBacon. ? Dairy: Ice cream. Sour cream. Whole milk. ? Beverages: Juice. Sugar-sweetened soft drinks. Beer. Liquor and spirits. ? Fats and oils: Butter. Canola oil. Vegetable oil. Beef fat (tallow). Lard. ? Sweets and desserts: Cookies. Cakes. Pies. Candy. ? Seasoning and other foods: Mayonnaise. Premade sauces and marinades. ? The items listed may not be a complete list. Talk with your dietitian about what dietary choices are right for you. Summary  The Mediterranean diet includes both food and lifestyle choices.  Eat a variety of fresh fruits and vegetables, beans, nuts, seeds, and whole grains.  Limit the amount of red meat and sweets that you eat.  Talk with your health care provider about whether it is safe for you to drink red wine in moderation. This means 1 glass a day for nonpregnant women and 2 glasses a day for men. A glass of wine equals 5 oz (150 mL). This information is not intended to replace advice given to you by your health care provider. Make sure you discuss any questions you have with your health care provider. Document Released: 02/04/2016 Document Revised: 03/08/2016 Document Reviewed: 02/04/2016 Elsevier Interactive Patient Education  2019 [employer] What side effects may I notice from receiving this medicine? Side effects that you should report to your doctor or health care professional as soon as possible: -allergic reactions like skin rash, itching or hives, swelling of the face, lips, or tongue -changes in breast like lumps, pain or fluids leaking from the nipple -pain in the testicles Side effects that usually do not require medical attention (report to your doctor or health care professional if they continue or are bothersome): -sexual difficulties like decreased sexual desire or ability to  get an erection -small amount of semen released during sex This list may not describe all possible side effects. Call your doctor for medical advice about side effects. You may report side effects to FDA at 1-800-FDA-1088. Where should I keep my medicine? Keep out of the reach of children. Store at room temperature between 15 and 30 degrees C (59 and 86 degrees F). Protect from moisture. Keep container tightly closed. Throw away any unused medicine after the expiration date. NOTE: This sheet is a summary. It may not cover all possible information. If you have questions about this medicine, talk to your doctor, pharmacist, or health care provider.  2019 Elsevier/Gold Standard (2016-07-29 09:36:49)  Alopecia Areata, Adult  Alopecia areata is a condition that causes you to lose hair. You may lose hair on your scalp in patches. In some cases, you may lose all the hair on your scalp (alopecia totalis) or all the hair from your face and body (alopecia  universalis). Alopecia areata is an autoimmune disease. This means that your body's defense system (immune system) mistakes normal parts of the body for germs or other things that can make you sick. When you have alopecia areata, the immune system attacks the hair follicles. Alopecia areata usually develops in childhood, but it can develop at any age. For some people, their hair grows back on its own and hair loss does not happen again. For others, their hair may fall out and grow back in cycles. The hair loss may last many years. Having this condition can be emotionally difficult, but it is not dangerous. What are the causes? The cause of this condition is not known. What increases the risk? This condition is more likely to develop in people who have:  A family history of alopecia.  A family history of another autoimmune disease, including type 1 diabetes and rheumatoid arthritis.  Asthma and allergies.  Down syndrome. What are the signs or  symptoms? Round spots of patchy hair loss on the scalp is the main symptom of this condition. The spots may be mildly itchy. Other symptoms include:  Short dark hairs in the bald patches that are wider at the top (exclamation point hairs).  Dents, white spots, or lines in the fingernails or toenails.  Balding and body hair loss. This is rare. How is this diagnosed? This condition is diagnosed based on your symptoms and family history. Your health care provider will also check your scalp skin, teeth, and nails. Your health care provider may refer you to a specialist in hair and skin disorders (dermatologist). You may also have tests, including:  A hair pull test.  Blood tests or other screening tests to check for autoimmune diseases, such as thyroid disease or diabetes.  Skin biopsy to confirm the diagnosis.  A procedure to examine the skin with a lighted magnifying instrument (dermoscopy). How is this treated? There is no cure for alopecia areata. Treatment is aimed at promoting the regrowth of hair and preventing the immune system from overreacting. No single treatment is right for all people with alopecia areata. It depends on the type of hair loss you have and how severe it is. Work with your health care provider to find the best treatment for you. Treatment may include:  Having regular checkups to make sure the condition is not getting worse (watchful waiting).  Steroid creams or pills for 6-8 weeks to stop the immune reaction and help hair to regrow more quickly.  Other topical medicines to alter the immune system response and support the hair growth cycle.  Steroid injections.  Therapy and counseling with a support group or therapist if you are having trouble coping with hair loss. Follow these instructions at home:  Learn as much as you can about your condition.  Apply topical creams only as told by your health care provider.  Take over-the-counter and prescription  medicines only as told by your health care provider.  Consider getting a wig or products to make hair look fuller or to cover bald spots, if you feel uncomfortable with your appearance.  Get therapy or counseling if you are having a hard time coping with hair loss. Ask your health care provider to recommend a counselor or support group.  Keep all follow-up visits as told by your health care provider. This is important. Contact a health care provider if:  Your hair loss gets worse, even with treatment.  You have new symptoms.  You are struggling emotionally. Summary  Alopecia areata is an autoimmune condition that makes your body's defense system (immune system) attack the hair follicles. This causes you to lose hair.  Treatments may include regular checkups to make sure that the condition is not getting worse (watchful waiting), medicines, and steroid injections. This information is not intended to replace advice given to you by your health care provider. Make sure you discuss any questions you have with your health care provider. Document Released: 01/16/2004 Document Revised: 07/01/2016 Document Reviewed: 07/01/2016 Elsevier Interactive Patient Education  2019 [employer].

## 2018-11-02 LAB — RENAL FUNCTION PANEL
Albumin: 4.6 g/dL (ref 4.0–5.0)
BUN/Creatinine Ratio: 16 (ref 9–20)
BUN: 14 mg/dL (ref 6–20)
CO2: 24 mmol/L (ref 20–29)
Calcium: 9.6 mg/dL (ref 8.7–10.2)
Chloride: 105 mmol/L (ref 96–106)
Creatinine, Ser: 0.87 mg/dL (ref 0.76–1.27)
GFR calc Af Amer: 130 mL/min/{1.73_m2} (ref >59)
GFR calc non Af Amer: 113 mL/min/{1.73_m2} (ref >59)
Glucose: 92 mg/dL (ref 65–99)
Phosphorus: 3.2 mg/dL (ref 2.8–4.1)
Potassium: 4.6 mmol/L (ref 3.5–5.2)
Sodium: 143 mmol/L (ref 134–144)

## 2018-11-02 LAB — LIPID PANEL
Chol/HDL Ratio: 4.4 ratio (ref 0.0–5.0)
Cholesterol, Total: 163 mg/dL (ref 100–199)
HDL: 37 mg/dL — ABNORMAL LOW (ref >39)
LDL Calculated: 79 mg/dL (ref 0–99)
Triglycerides: 237 mg/dL — ABNORMAL HIGH (ref 0–149)
VLDL Cholesterol Cal: 47 mg/dL — ABNORMAL HIGH (ref 5–40)

## 2018-11-14 DIAGNOSIS — M21241 Flexion deformity, right finger joints: Secondary | ICD-10-CM | POA: Diagnosis not present

## 2019-01-15 ENCOUNTER — Ambulatory Visit: Payer: BC Managed Care – PPO | Attending: Orthopedic Surgery | Admitting: Occupational Therapy

## 2019-01-15 ENCOUNTER — Other Ambulatory Visit: Payer: Self-pay

## 2019-01-15 ENCOUNTER — Encounter: Payer: Self-pay | Admitting: Occupational Therapy

## 2019-01-15 DIAGNOSIS — M6281 Muscle weakness (generalized): Secondary | ICD-10-CM | POA: Diagnosis not present

## 2019-01-15 DIAGNOSIS — M25641 Stiffness of right hand, not elsewhere classified: Secondary | ICD-10-CM

## 2019-01-15 NOTE — Therapy (Signed)
[facility] [address] [place], [address] Phone: [phone]   Fax:  [phone]  Occupational Therapy Evaluation  Patient Details  Name: Ralph Ashley MRN: [id number] Date of Birth: Jun 24, 1986 Referring Provider (OT): Reche Dixon   Encounter Date: 01/15/2019  OT End of Session - 01/15/19 1343    Visit Number  1    Number of Visits  4    Date for OT Re-Evaluation  02/12/19    OT Start Time  0803    OT Stop Time  0855    OT Time Calculation (min)  52 min    Activity Tolerance  Patient tolerated treatment well    Behavior During Therapy  [facility] for tasks assessed/performed       Past Medical History:  Diagnosis Date  . Allergy   . Sleep apnea    uses cpap    History reviewed. No pertinent surgical history.  There were no vitals filed for this visit.  Subjective Assessment - 01/15/19 1334    Subjective   I remember about year ago my finger hurt - and I do pop my fingers a lot - did play football in HS - but the last few months my finger just weak - cannot pull trigger on gun with target shooting , pulling starter on weedeater with my pointer finger- no pain or swelling    Patient Stated Goals  Want to be able to bend my R pointer finger to pull trigger, starter for weedeater    Currently in Pain?  No/denies        Ralph Ashley OT Assessment - 01/15/19 0001      Assessment   Medical Diagnosis  Weakness and stiffness in R 2nd digits     Referring Provider (OT)  Reche Dixon    Onset Date/Surgical Date  01/14/18    Hand Dominance  Right      Home  Environment   Lives With  Family      Prior Function   Vocation  Full time employment    Leisure  Targer shooting, yard work       Strength   Right Hand Grip (lbs)  35    Right Hand Lateral Pinch  10 lbs    Right Hand 3 Point Pinch  3.5 lbs    Left Hand Grip (lbs)  28    Left Hand Lateral Pinch  10 lbs    Left Hand 3 Point Pinch  10 lbs      Right  Hand AROM   R Index  MCP 0-90  80 Degrees    R Index PIP 0-100  85 Degrees   in session place and hold 95   R Index DIP 0-70  35 Degrees   place and hold 65     Left Hand AROM   L Index  MCP 0-90  90 Degrees    L Index PIP 0-100  100 Degrees    L Index DIP 0-70  70 Degrees       review HEP with pt     PROM for  2nd DIP/PIP flexion stretch 3 x 30 sec  Composite flexion stretch 3 x 30 sec - 2nd digit   MC flexion - lumbrical fist 10 reps Block intrinsic fist - place and hold 10 reps  composite flexion place and hold towards 1 -2 cm target in palm  10 reps  Increase to 2-3 sets  Putty soft blue -  3 point pinch into short snake 3 x 10 reps Grip into 3 cm soft blue putty ball with 2nd digit  3 x day  Buddy strap with act wear he keeps 2nd digit extended             OT Education - 01/15/19 1343    Education Details  findings of eval and review HEP    Person(s) Educated  Patient    Methods  Explanation;Demonstration;Tactile cues;Handout    Comprehension  Verbalized understanding;Returned demonstration          OT Long Term Goals - 01/15/19 1352      OT LONG TERM GOAL #1   Title  Pt R 2nd digit flexion [facility] to [facility] to [facility] 1 cm object in palm    Baseline  MC 80 , PIP 85, DIP 35 -    Time  2    Period  Weeks    Status  New    Target Date  01/29/19      OT LONG TERM GOAL #2   Title  Strength in R 2nd digit flexion increase to  5 lbs with 3 point pinch  to pull trigger  with targer shooting    Baseline  3 point pinch R 3.5 lbs, L 10 lbs    Time  4    Period  Weeks    Status  New    Target Date  02/12/19            Plan - 01/15/19 1344    Clinical Impression Statement  Pt refer to OT hand therapy for R dominant hand 2nd digit weakness and stiffness- pt has full PROM - and AROM to MC 80, PIP 85 and DIP 35 - was able to get more AROM with place and hold - no pain or edema - pt do report about year ago he had some pain in that finger - but denies it  currently - decrease strength with muscle test 2/5 -and pinch grip decrease too - pt do show some tenolysis use with making fist and has to use wrist flexion to open hands - grip and prehesion decrease for his age - pt do report family history of muscle dystrophy    OT Occupational Profile and History  Problem Focused Assessment - Including review of records relating to presenting problem    Occupational performance deficits (Please refer to evaluation for details):  Play;Leisure    Body Structure / Function / Physical Skills  ROM;UE functional use;Flexibility;Strength;Dexterity    Rehab Potential  Fair    Clinical Decision Making  Limited treatment options, no task modification necessary    Comorbidities Affecting Occupational Performance:  May have comorbidities impacting occupational performance   family history of muscle dysthrophy   Modification or Assistance to Complete Evaluation   No modification of tasks or assist necessary to complete eval    OT Frequency  1x / week    OT Duration  4 weeks    OT Treatment/Interventions  Therapeutic exercise;Patient/family education;Therapeutic activities;Passive range of motion;Manual Therapy    Plan  assess progress with HEP    OT Home Exercise Plan  see pt instruction    Consulted and Agree with Plan of Care  Patient       Patient will benefit from skilled therapeutic intervention in order to improve the following deficits and impairments:   Body Structure / Function / Physical Skills: ROM, UE functional use, Flexibility, Strength, Dexterity  Visit Diagnosis: 1. Stiffness of right hand, not elsewhere classified   2. Muscle weakness (generalized)       Problem List Patient Active Problem List   Diagnosis Date Noted  . Annual physical exam 09/29/2015    Ralph Ashley 01/15/2019, 1:56 PM  [facility] [address] [place], [place], [address] Phone:  [phone]   Fax:  [phone]  Name: Ralph Ashley MRN: [id number] Date of Birth: 1986/01/30

## 2019-01-15 NOTE — Patient Instructions (Signed)
PROM for  2nd DIP/PIP flexion stretch 3 x 30 sec  Composite flexion stretch 3 x 30 sec - 2nd digit   MC flexion - lumbrical fist 10 reps Block intrinsic fist - place and hold 10 reps  composite flexion place and hold towards 1 -2 cm target in palm  10 reps  Increase to 2-3 sets  Putty soft blue - 3 point pinch into short snake 3 x 10 reps Grip into 3 cm soft blue putty ball with 2nd digit  3 x day  Buddy strap with act wear he keeps 2nd digit extended

## 2019-01-24 ENCOUNTER — Ambulatory Visit: Payer: BC Managed Care – PPO | Admitting: Occupational Therapy

## 2019-01-24 ENCOUNTER — Other Ambulatory Visit: Payer: Self-pay

## 2019-01-24 DIAGNOSIS — M25641 Stiffness of right hand, not elsewhere classified: Secondary | ICD-10-CM | POA: Diagnosis not present

## 2019-01-24 DIAGNOSIS — M6281 Muscle weakness (generalized): Secondary | ICD-10-CM | POA: Diagnosis not present

## 2019-01-24 NOTE — Patient Instructions (Signed)
Cont same HEP  But add interlocking  Intrinsic fist -R and L - place and hold AROM of PIP flexion  over edge of table  Rolling thin pen with index finger on volar thumb  10 reps 2 x day increase sets every 3  Days 2nd and 3rd digit <> palm - retrieve 3 cm ball fingers out of palm - using flexion of 2nd as much as he can And juggle 2 golf balls in palm around - keeping 2nd digit in contact with ball as long as he can   Cont putty same HEP

## 2019-01-24 NOTE — Therapy (Signed)
[facility] [address] [place], [place], [address] Phone: [phone]   Fax:  [phone]  Occupational Therapy Treatment  Patient Details  Name: Ralph Ashley MRN: [id number] Date of Birth: Nov 08, 1985 Referring Provider (OT): Dedra Skeensodd Mundy   Encounter Date: 01/24/2019  OT End of Session - 01/24/19 0802    Visit Number  2    Number of Visits  4    Date for OT Re-Evaluation  02/12/19    OT Start Time  0803    OT Stop Time  0840    OT Time Calculation (min)  37 min    Activity Tolerance  Patient tolerated treatment well    Behavior During Therapy  [facility] for tasks assessed/performed       Past Medical History:  Diagnosis Date  . Allergy   . Sleep apnea    uses cpap    No past surgical history on file.  There were no vitals filed for this visit.  Subjective Assessment - 01/24/19 0846    Subjective   Doing okay - did my exercises - little bit better - but sore - I tried to use my index finger more at home    Patient Stated Goals  Want to be able to bend my R pointer finger to pull trigger, starter for weedeater    Currently in Pain?  No/denies         [facility] Assessment - 01/24/19 0001      Strength   Right Hand Grip (lbs)  30    Right Hand Lateral Pinch  8 lbs   sore today    Right Hand 3 Point Pinch  4.5 lbs    Left Hand Grip (lbs)  37    Left Hand Lateral Pinch  10 lbs    Left Hand 3 Point Pinch  10 lbs      Right Hand AROM   R Index  MCP 0-90  85 Degrees    R Index PIP 0-100  85 Degrees   in session 92    R Index DIP 0-70  45 Degrees   in session 52      assess progress  Composite flexion and intrinsic fist block Composite flexion DIP/PIP flexion and composite  Cont at home         OT Treatments/Exercises (OP) - 01/24/19 0001      RUE Fluidotherapy   Number Minutes Fluidotherapy  8 Minutes    RUE Fluidotherapy Location  Hand    Comments  after assessment to decrease  soreness prior to review of HEP       interlocking  Intrinsic fist -R and L - wth focus on pinkie against 2nd digit middle phalanges -  place and hold first before resistance  10 reps  Hold 3 ec  AROM of PIP flexion  over edge of table - place and hold - intrinsic fist position 10 reps hold 3 sec  Rolling thin pen with index finger on volar thumb  - with great results - for PIP and DIP engaging too  10 reps 2 x day increase sets every 3  Days - up to 3 sets 2nd and 3rd digit <> palm - retrieve 3 cm ball using 2nd and 3rd  fingers out of palm - using flexion of 2nd as much as he can 1 min or 2 x 30 sec  And juggle 2 golf balls in palm around - keeping 2nd digit in  contact with ball as long as he can while rotating and pushing into the palm   Cont putty same HEP Pt fatigue in 2nd digit - need rest breaks        OT Education - 01/24/19 0802    Education Details  progress and changes to HEP    Person(s) Educated  Patient    Methods  Explanation;Demonstration;Tactile cues;Handout    Comprehension  Verbalized understanding;Returned demonstration          OT Long Term Goals - 01/15/19 1352      OT LONG TERM GOAL #1   Title  Pt R 2nd digit flexion improve to [facility] to hold 1 cm object in palm    Baseline  MC 80 , PIP 85, DIP 35 -    Time  2    Period  Weeks    Status  New    Target Date  01/29/19      OT LONG TERM GOAL #2   Title  Strength in R 2nd digit flexion increase to  5 lbs with 3 point pinch  to pull trigger  with targer shooting    Baseline  3 point pinch R 3.5 lbs, L 10 lbs    Time  4    Period  Weeks    Status  New    Target Date  02/12/19            Plan - 01/24/19 0802    Clinical Impression Statement  Pt arrive this date with increase of 5 degrees flexion at [facility] and 10 at DIP - during composite - did had increase of AROM in session to 92 PIP , 52 at DIP - place and hold better - pt provided with some functional AAROM exercises resulting in increase DIP and  PIP flexion - 2/10 strength in PIP and 2-/5 for DIP    OT Occupational Profile and History  Problem Focused Assessment - Including review of records relating to presenting problem    Occupational performance deficits (Please refer to evaluation for details):  Play;Leisure    Body Structure / Function / Physical Skills  ROM;UE functional use;Flexibility;Strength;Dexterity    Rehab Potential  Fair    Clinical Decision Making  Limited treatment options, no task modification necessary    Comorbidities Affecting Occupational Performance:  May have comorbidities impacting occupational performance    Modification or Assistance to Complete Evaluation   No modification of tasks or assist necessary to complete eval    OT Frequency  1x / week    OT Duration  4 weeks    OT Treatment/Interventions  Therapeutic exercise;Patient/family education;Therapeutic activities;Passive range of motion;Manual Therapy    Plan  assess progress with HEP and upgrade as needed    OT Home Exercise Plan  see pt instruction    Consulted and Agree with Plan of Care  Patient       Patient will benefit from skilled therapeutic intervention in order to improve the following deficits and impairments:   Body Structure / Function / Physical Skills: ROM, UE functional use, Flexibility, Strength, Dexterity       Visit Diagnosis: 1. Stiffness of right hand, not elsewhere classified   2. Muscle weakness (generalized)       Problem List Patient Active Problem List   Diagnosis Date Noted  . Annual physical exam 09/29/2015    Oletta CohnDuPreez, Kamara Allan OTR/L,[name] 01/24/2019, 9:29 AM  [facility] [address] [place],  [place], [address] Phone: [phone]   Fax:  [phone]  Name: Ralph Ashley MRN: [id number] Date of Birth: February 27, 1986

## 2019-02-04 ENCOUNTER — Other Ambulatory Visit: Payer: Self-pay

## 2019-02-04 ENCOUNTER — Ambulatory Visit: Payer: BC Managed Care – PPO | Attending: Orthopedic Surgery | Admitting: Occupational Therapy

## 2019-02-04 DIAGNOSIS — M25641 Stiffness of right hand, not elsewhere classified: Secondary | ICD-10-CM | POA: Insufficient documentation

## 2019-02-04 DIAGNOSIS — M6281 Muscle weakness (generalized): Secondary | ICD-10-CM | POA: Insufficient documentation

## 2019-02-04 NOTE — Therapy (Signed)
[place] [facility] [address], [place], [address] Phone: [phone]   Fax:  [phone]  Occupational Therapy Treatment  Patient Details  Name: Ralph Ashley MRN: [id number] Date of Birth: 12-21-1985 Referring Provider (OT): Reche Dixon   Encounter Date: 02/04/2019  OT End of Session - 02/04/19 0823    Visit Number  3    Number of Visits  4    Date for OT Re-Evaluation  02/12/19    OT Start Time  0802    OT Stop Time  0845    OT Time Calculation (min)  43 min    Activity Tolerance  Patient tolerated treatment well    Behavior During Therapy  [facility] for tasks assessed/performed       Past Medical History:  Diagnosis Date  . Allergy   . Sleep apnea    uses cpap    No past surgical history on file.  There were no vitals filed for this visit.  Subjective Assessment - 02/04/19 0820    Subjective   I am trying to  use it more - where I use to stick my finger out and use middle - now using all 3 to pull package open - sore after I work    Patient Stated Goals  Want to be able to bend my R pointer finger to pull trigger, starter for weedeater    Currently in Pain?  Yes    Pain Score  4     Pain Location  Finger (Comment which one)    Pain Orientation  Right    Pain Descriptors / Indicators  Sore    Pain Type  Acute pain    Pain Onset  1 to 4 weeks ago         [facility] OT Assessment - 02/04/19 0001      Strength   Right Hand Grip (lbs)  32    Right Hand Lateral Pinch  11 lbs    Right Hand 3 Point Pinch  6 lbs   2 point pinch 1.5 R , L 9 lbs    Left Hand Grip (lbs)  37    Left Hand Lateral Pinch  10 lbs    Left Hand 3 Point Pinch  10 lbs      Right Hand AROM   R Index  MCP 0-90  90 Degrees    R Index PIP 0-100  90 Degrees    R Index DIP 0-70  50 Degrees   45 blocked     progress again this time at DIP , PIP flexion - improve slow but steady  More soreness - pt only doing HEP 1 x day   Reinforce to do more over weekend -and some functional AAROM , and strengthening during day          OT Treatments/Exercises (OP) - 02/04/19 0001      RUE Fluidotherapy   Number Minutes Fluidotherapy  8 Minutes    RUE Fluidotherapy Location  Hand    Comments  decrease soreness and increase ROM        interlocking  Intrinsic fist -R and L - wth focus on pinkie against 2nd digit middle phalanges -  place and hold first before resistance  10 reps  Hold 3 ec  AROM of PIP flexion  over edge of table - place and hold - intrinsic fist position 10 reps hold 3 sec - make sure table thin to allow DIP  flexion  Isometric to DIP afterwards   Rolling thin pen with index finger on volar thumb  - with great results - for PIP and DIP engaging too - pt to do in car several times during day  10 reps 2 x day increase sets every 3  Days - up to 3 sets  2nd and 3rd digit <> palm - retrieve 3 cm ball using 2nd and 3rd  fingers out of palm - using flexion of 2nd as much as he can 1 min or 2 x 30 sec  Also to do several times during day  Cont putty same HEP Pt fatigue in 2nd digit - need rest breaks - and soreness if isolating   Pt to use 2 point grip with lego blocks with son - several times during day- pinch, pull , push isolating for strengthening            OT Education - 02/04/19 0823    Education Details  HEP changes    Person(s) Educated  Patient    Methods  Explanation;Demonstration;Tactile cues;Handout    Comprehension  Verbalized understanding;Returned demonstration          OT Long Term Goals - 01/15/19 1352      OT LONG TERM GOAL #1   Title  Pt R 2nd digit flexion improve to [facility] to hold 1 cm object in palm    Baseline  MC 80 , PIP 85, DIP 35 -    Time  2    Period  Weeks    Status  New    Target Date  01/29/19      OT LONG TERM GOAL #2   Title  Strength in R 2nd digit flexion increase to  5 lbs with 3 point pinch  to pull trigger  with targer shooting     Baseline  3 point pinch R 3.5 lbs, L 10 lbs    Time  4    Period  Weeks    Status  New    Target Date  02/12/19            Plan - 02/04/19 [id number]    Clinical Impression Statement  Pt is making slow but steady progress - report sorness in PIP -and stiffness at times - pt was 1-2/10 strength [facility] at PIP and DIP - not 3-/5 - pt to cont with AROM , AAROM , place and hold - and some strengthening isolating 2 point pinch    OT Occupational Profile and History  Problem Focused Assessment - Including review of records relating to presenting problem    Occupational performance deficits (Please refer to evaluation for details):  Play;Leisure    Body Structure / Function / Physical Skills  ROM;UE functional use;Flexibility;Strength;Dexterity    Rehab Potential  Fair    Clinical Decision Making  Limited treatment options, no task modification necessary    Comorbidities Affecting Occupational Performance:  May have comorbidities impacting occupational performance    Modification or Assistance to Complete Evaluation   No modification of tasks or assist necessary to complete eval    OT Frequency  1x / week    OT Duration  4 weeks    OT Treatment/Interventions  Therapeutic exercise;Patient/family education;Therapeutic activities;Passive range of motion;Manual Therapy    Plan  assess progress with HEP and upgrade as needed    OT Home Exercise Plan  see pt instruction    Consulted and Agree with Plan of Care  Patient  Patient will benefit from skilled therapeutic intervention in order to improve the following deficits and impairments:   Body Structure / Function / Physical Skills: ROM, UE functional use, Flexibility, Strength, Dexterity       Visit Diagnosis: 1. Stiffness of right hand, not elsewhere classified   2. Muscle weakness (generalized)       Problem List Patient Active Problem List   Diagnosis Date Noted  . Annual physical exam 09/29/2015    Oletta CohnDuPreez, Jawan Chavarria 02/04/2019,  12:51 PM  [facility] [address] [place], [place], [id number] Phone: [phone]   Fax:  [phone]  Name: Ralph Ashley MRN: [id number] Date of Birth: [birth year]

## 2019-02-04 NOTE — Patient Instructions (Signed)
Same HEP - but work on strengthening - doing lego block with son - using 2 pinch with different sizes and pushing down

## 2019-02-14 ENCOUNTER — Ambulatory Visit: Payer: BC Managed Care – PPO | Admitting: Occupational Therapy

## 2019-07-19 ENCOUNTER — Ambulatory Visit (INDEPENDENT_AMBULATORY_CARE_PROVIDER_SITE_OTHER): Payer: BC Managed Care – PPO | Admitting: Family Medicine

## 2019-07-19 ENCOUNTER — Ambulatory Visit
Admission: RE | Admit: 2019-07-19 | Discharge: 2019-07-19 | Disposition: A | Discharge status: Home / Self Care | Payer: BC Managed Care – PPO | Source: Ambulatory Visit | Attending: Family Medicine | Admitting: Family Medicine

## 2019-07-19 ENCOUNTER — Other Ambulatory Visit: Payer: Self-pay

## 2019-07-19 ENCOUNTER — Ambulatory Visit
Admission: RE | Admit: 2019-07-19 | Discharge: 2019-07-19 | Disposition: A | Discharge status: Home / Self Care | Payer: BC Managed Care – PPO | Attending: Family Medicine | Admitting: Family Medicine

## 2019-07-19 ENCOUNTER — Encounter: Payer: Self-pay | Admitting: Family Medicine

## 2019-07-19 VITALS — BP 110/70 | HR 80 | Temp 97.8°F | Ht 74.0 in | Wt 222.0 lb

## 2019-07-19 DIAGNOSIS — R05 Cough: Secondary | ICD-10-CM

## 2019-07-19 DIAGNOSIS — K219 Gastro-esophageal reflux disease without esophagitis: Secondary | ICD-10-CM | POA: Diagnosis not present

## 2019-07-19 DIAGNOSIS — R079 Chest pain, unspecified: Secondary | ICD-10-CM | POA: Diagnosis not present

## 2019-07-19 DIAGNOSIS — R059 Cough, unspecified: Secondary | ICD-10-CM

## 2019-07-19 DIAGNOSIS — J019 Acute sinusitis, unspecified: Secondary | ICD-10-CM

## 2019-07-19 MED ORDER — AZITHROMYCIN 250 MG PO TABS: ORAL_TABLET | ORAL | 0 refills | Status: DC

## 2019-07-19 MED ORDER — PANTOPRAZOLE SODIUM 40 MG PO TBEC: 40.0000 mg | DELAYED_RELEASE_TABLET | Freq: Every day | ORAL | 3 refills | Status: DC

## 2019-07-19 NOTE — Progress Notes (Signed)
Date:  07/19/2019   Name:  Ralph Ashley   DOB:  1985/08/31   MRN:  [id number]   Chief Complaint: Sinusitis (starts either first thing in the am or after eating at night- has congestion that has orange/ red production/ some chills, low grade 99.4 at night- as day goes on starts to feel better.)  Sinusitis This is a new problem. The current episode started in the past 7 days. Associated symptoms include chills, coughing and diaphoresis. Pertinent negatives include no congestion, ear pain, headaches, hoarse voice, neck pain, shortness of breath, sinus pressure, sneezing, sore throat or swollen glands. (Temp 99.4) Past treatments include nothing. The treatment provided mild relief.  Cough This is a new problem. The current episode started more than 1 month ago. The problem has been waxing and waning. The problem occurs every few minutes. The cough is productive of sputum and productive of purulent sputum. Associated symptoms include chills, hemoptysis, postnasal drip and rhinorrhea. Pertinent negatives include no chest pain, ear congestion, ear pain, fever, headaches, heartburn, myalgias, nasal congestion, rash, sore throat, shortness of breath, sweats, weight loss or wheezing. The treatment provided moderate relief. There is no history of asthma, bronchiectasis, bronchitis, COPD, emphysema, environmental allergies or pneumonia.    Lab Results  Component Value Date   CREATININE 0.87 11/01/2018   BUN 14 11/01/2018   NA 143 11/01/2018   K 4.6 11/01/2018   CL 105 11/01/2018   CO2 24 11/01/2018   Lab Results  Component Value Date   CHOL 163 11/01/2018   HDL 37 (L) 11/01/2018   LDLCALC 79 11/01/2018   TRIG 237 (H) 11/01/2018   CHOLHDL 4.4 11/01/2018   No results found for: TSH No results found for: HGBA1C   Review of Systems  Constitutional: Positive for chills and diaphoresis. Negative for fever and weight loss.  HENT: Positive for postnasal drip and rhinorrhea. Negative for  congestion, drooling, ear discharge, ear pain, hoarse voice, sinus pressure, sneezing and sore throat.   Respiratory: Positive for cough and hemoptysis. Negative for shortness of breath and wheezing.   Cardiovascular: Negative for chest pain, palpitations and leg swelling.  Gastrointestinal: Negative for abdominal pain, blood in stool, constipation, diarrhea, heartburn and nausea.  Endocrine: Negative for polydipsia.  Genitourinary: Negative for dysuria, frequency, hematuria and urgency.  Musculoskeletal: Negative for back pain, myalgias and neck pain.  Skin: Negative for rash.  Allergic/Immunologic: Negative for environmental allergies.  Neurological: Negative for dizziness and headaches.  Hematological: Does not bruise/bleed easily.  Psychiatric/Behavioral: Negative for suicidal ideas. The patient is not nervous/anxious.     Patient Active Problem List   Diagnosis Date Noted  . Annual physical exam 09/29/2015    Allergies  Allergen Reactions  . Sulfa Antibiotics     No past surgical history on file.  Social History   Tobacco Use  . Smoking status: Never Smoker  . Smokeless tobacco: Never Used  Substance Use Topics  . Alcohol use: Yes    Alcohol/week: 0.0 standard drinks  . Drug use: No     Medication list has been reviewed and updated.  Current Meds  Medication Sig  . finasteride (PROPECIA) 1 MG tablet Take 1 tablet (1 mg total) by mouth daily.    PHQ 2/9 Scores 07/19/2019 10/30/2017 09/29/2015 08/05/2015  PHQ - 2 Score 0 0 0 0  PHQ- 9 Score 0 0 - -    BP Readings from Last 3 Encounters:  07/19/19 110/70  11/01/18 102/70  10/30/17 100/80  Physical Exam Vitals and nursing note reviewed.  HENT:     Head: Normocephalic.     Right Ear: Tympanic membrane, ear canal and external ear normal.     Left Ear: Tympanic membrane, ear canal and external ear normal.     Nose: Nose normal. No congestion or rhinorrhea.     Mouth/Throat:     Mouth: Mucous membranes are  moist.     Pharynx: No oropharyngeal exudate or posterior oropharyngeal erythema.  Eyes:     General: No scleral icterus.       Right eye: No discharge.        Left eye: No discharge.     Conjunctiva/sclera: Conjunctivae normal.     Pupils: Pupils are equal, round, and reactive to light.  Neck:     Thyroid: No thyromegaly.     Vascular: No JVD.     Trachea: No tracheal deviation.  Cardiovascular:     Rate and Rhythm: Normal rate and regular rhythm.     Heart sounds: Normal heart sounds. No murmur. No friction rub. No gallop.   Pulmonary:     Effort: No respiratory distress.     Breath sounds: Normal breath sounds. No wheezing, rhonchi or rales.  Abdominal:     General: Bowel sounds are normal.     Palpations: Abdomen is soft. There is no mass.     Tenderness: There is no abdominal tenderness. There is no guarding or rebound.     Hernia: No hernia is present.  Musculoskeletal:        General: No tenderness. Normal range of motion.     Cervical back: Normal range of motion and neck supple.  Lymphadenopathy:     Cervical: No cervical adenopathy.  Skin:    General: Skin is warm.     Findings: No rash.  Neurological:     Mental Status: He is alert and oriented to person, place, and time.     Cranial Nerves: No cranial nerve deficit.     Deep Tendon Reflexes: Reflexes are normal and symmetric.     Wt Readings from Last 3 Encounters:  07/19/19 222 lb (100.7 kg)  11/01/18 222 lb (100.7 kg)  10/30/17 221 lb (100.2 kg)    BP 110/70   Pulse 80   Temp 97.8 F (36.6 C) (Oral)   Ht 6\' 2"  (1.88 m)   Wt 222 lb (100.7 kg)   SpO2 97%   BMI 28.50 kg/m   Assessment and Plan:  1. Acute non-recurrent sinusitis, unspecified location Acute.  Persistent.  Patient's been having off-and-on upper respiratory concerns for 2 weeks..  We will cover with azithromycin  today and patient is to recheck in with [place] if this is not been successful. - azithromycin (ZITHROMAX) 250 MG tablet; 2 today  then 1 a day for 4 days  Dispense: 6 tablet; Refill: 0  2. Cough For several weeks patient has had a initially nonproductive cough which became productive.  Is also noted to have throat clearing in the mornings.  We will get a chest x-ray given the persistent nature of this and during the Covid concerns to rule out pneumonia of a community-acquired nature.  Patient's chest x-ray was reviewed and it is normal for cardiopulmonary concerns. - DG Chest 2 View; Future  3. Gastroesophageal reflux disease, unspecified whether esophagitis present Since he wakes up with the symptoms this may be consistent with reflux and we will initiate pantoprazole 40 mg once a day for a 2 to  67-month.  To see if there is resolution of symptoms. - pantoprazole (PROTONIX) 40 MG tablet; Take 1 tablet (40 mg total) by mouth daily.  Dispense: 30 tablet; Refill: 3

## 2020-01-09 ENCOUNTER — Telehealth: Payer: Self-pay | Admitting: Family Medicine

## 2020-01-09 NOTE — Telephone Encounter (Signed)
Called- will do labs after visit

## 2020-01-09 NOTE — Telephone Encounter (Signed)
Copied from CRM [id number]. Topic: General - Inquiry >> Jan 09, 2020  9:11 AM Deborha Payment wrote: Reason for CRM: Patient would like PCP to put in lab orders before CPE. Patient would like a full panel done.  Call back [phone]

## 2020-01-16 ENCOUNTER — Ambulatory Visit (INDEPENDENT_AMBULATORY_CARE_PROVIDER_SITE_OTHER): Payer: BC Managed Care – PPO | Admitting: Family Medicine

## 2020-01-16 ENCOUNTER — Encounter: Payer: Self-pay | Admitting: Family Medicine

## 2020-01-16 ENCOUNTER — Other Ambulatory Visit: Payer: Self-pay

## 2020-01-16 VITALS — BP 120/62 | HR 60 | Ht 74.0 in | Wt 221.0 lb

## 2020-01-16 DIAGNOSIS — Z1211 Encounter for screening for malignant neoplasm of colon: Secondary | ICD-10-CM

## 2020-01-16 DIAGNOSIS — Z0000 Encounter for general adult medical examination without abnormal findings: Secondary | ICD-10-CM | POA: Diagnosis not present

## 2020-01-16 LAB — HEMOCCULT GUIAC POC 1CARD (OFFICE): Fecal Occult Blood, POC: NEGATIVE

## 2020-01-16 MED ORDER — SILDENAFIL CITRATE 20 MG PO TABS: 20.0000 mg | ORAL_TABLET | ORAL | 0 refills | Status: DC | PRN

## 2020-01-16 NOTE — Progress Notes (Signed)
Date:  01/16/2020   Name:  Ralph Ashley   DOB:  11/03/1985   MRN:  [id number]   Chief Complaint: Annual Exam  Patient is a 34 year old male who presents for a comprehensive physical exam. The patient reports the following problems: none. Health maintenance has been reviewed up to date.   Lab Results  Component Value Date   CREATININE 0.87 11/01/2018   BUN 14 11/01/2018   NA 143 11/01/2018   K 4.6 11/01/2018   CL 105 11/01/2018   CO2 24 11/01/2018   Lab Results  Component Value Date   CHOL 163 11/01/2018   HDL 37 (L) 11/01/2018   LDLCALC 79 11/01/2018   TRIG 237 (H) 11/01/2018   CHOLHDL 4.4 11/01/2018   No results found for: TSH No results found for: HGBA1C No results found for: WBC, HGB, HCT, MCV, PLT No results found for: ALT, AST, GGT, ALKPHOS, BILITOT   Review of Systems  Constitutional: Negative for chills and fever.  HENT: Negative for drooling, ear discharge, ear pain and sore throat.   Respiratory: Negative for cough, shortness of breath and wheezing.   Cardiovascular: Negative for chest pain, palpitations and leg swelling.  Gastrointestinal: Negative for abdominal pain, blood in stool, constipation, diarrhea and nausea.  Endocrine: Negative for polydipsia.  Genitourinary: Negative for dysuria, frequency, hematuria and urgency.  Musculoskeletal: Negative for back pain, myalgias and neck pain.  Skin: Negative for rash.  Allergic/Immunologic: Negative for environmental allergies.  Neurological: Negative for dizziness and headaches.  Hematological: Does not bruise/bleed easily.  Psychiatric/Behavioral: Negative for suicidal ideas. The patient is not nervous/anxious.     Patient Active Problem List   Diagnosis Date Noted  . Annual physical exam 09/29/2015    Allergies  Allergen Reactions  . Sulfa Antibiotics     No past surgical history on file.  Social History   Tobacco Use  . Smoking status: Never Smoker  . Smokeless tobacco: Never Used    Substance Use Topics  . Alcohol use: Yes    Alcohol/week: 0.0 standard drinks  . Drug use: No     Medication list has been reviewed and updated.  Current Meds  Medication Sig  . pantoprazole (PROTONIX) 40 MG tablet Take 1 tablet (40 mg total) by mouth daily.  . sildenafil (REVATIO) 20 MG tablet Take 20 mg by mouth 3 (three) times daily. On line    PHQ 2/9 Scores 01/16/2020 07/19/2019 10/30/2017 09/29/2015  PHQ - 2 Score 0 0 0 0  PHQ- 9 Score 0 0 0 -    GAD 7 : Generalized Anxiety Score 01/16/2020 07/19/2019  Nervous, Anxious, on Edge 0 0  Control/stop worrying 0 0  Worry too much - different things 0 0  Trouble relaxing 0 0  Restless 0 0  Easily annoyed or irritable 0 0  Afraid - awful might happen 0 0  Total GAD 7 Score 0 0    BP Readings from Last 3 Encounters:  01/16/20 120/62  07/19/19 110/70  11/01/18 102/70    Physical Exam Vitals and nursing note reviewed.  Constitutional:      Appearance: Normal appearance. He is well-developed, well-groomed and normal weight.  HENT:     Head: Normocephalic.     Jaw: There is normal jaw occlusion.     Right Ear: Hearing, tympanic membrane, ear canal and external ear normal.     Left Ear: Hearing, tympanic membrane, ear canal and external ear normal.     Nose:  Nose normal.     Mouth/Throat:     Lips: Pink.     Mouth: Mucous membranes are moist.     Dentition: Normal dentition.     Tongue: No lesions. Tongue does not deviate from midline.     Palate: No mass and lesions.     Pharynx: Oropharynx is clear. Uvula midline.  Eyes:     General: Lids are normal. Vision grossly intact. Gaze aligned appropriately. No scleral icterus.       Right eye: No discharge.        Left eye: No discharge.     Conjunctiva/sclera: Conjunctivae normal.     Pupils: Pupils are equal, round, and reactive to light.     Funduscopic exam:    Right eye: Red reflex present.        Left eye: Red reflex present. Neck:     Thyroid: No thyroid mass,  thyromegaly or thyroid tenderness.     Vascular: Normal carotid pulses. No carotid bruit, hepatojugular reflux or JVD.     Trachea: Trachea and phonation normal. No tracheal deviation.  Cardiovascular:     Rate and Rhythm: Normal rate and regular rhythm.     Chest Wall: PMI is not displaced. No thrill.     Pulses: Normal pulses.          Carotid pulses are 2+ on the right side and 2+ on the left side.      Radial pulses are 2+ on the right side and 2+ on the left side.       Femoral pulses are 2+ on the right side and 2+ on the left side.      Popliteal pulses are 2+ on the right side and 2+ on the left side.       Dorsalis pedis pulses are 2+ on the right side and 2+ on the left side.       Posterior tibial pulses are 2+ on the right side and 2+ on the left side.     Heart sounds: Normal heart sounds, S1 normal and S2 normal. No murmur heard.  No systolic murmur is present.  No diastolic murmur is present.  No friction rub. No gallop. No S3 or S4 sounds.   Pulmonary:     Effort: No respiratory distress.     Breath sounds: Normal breath sounds. No decreased breath sounds, wheezing, rhonchi or rales.  Chest:     Chest wall: No mass.     Breasts: Breasts are symmetrical.        Right: Normal.        Left: Normal.  Abdominal:     General: Bowel sounds are normal.     Palpations: Abdomen is soft. There is no hepatomegaly, splenomegaly or mass.     Tenderness: There is no abdominal tenderness. There is no guarding or rebound.     Hernia: There is no hernia in the umbilical area, ventral area, left inguinal area or right inguinal area.  Genitourinary:    Penis: Normal.      Testes: Normal.        Right: Mass not present.     Epididymis:     Right: Normal.     Left: Normal.     Prostate: Normal. Not enlarged, not tender and no nodules present.     Rectum: Normal.  Musculoskeletal:        General: No tenderness. Normal range of motion.     Cervical back: Normal range of motion  and  neck supple.     Left lower leg: No edema.  Lymphadenopathy:     Head:     Right side of head: No submandibular adenopathy.     Left side of head: No submandibular adenopathy.     Cervical: No cervical adenopathy.     Right cervical: No superficial, deep or posterior cervical adenopathy.    Left cervical: No superficial, deep or posterior cervical adenopathy.     Upper Body:     Right upper body: No supraclavicular or axillary adenopathy.     Left upper body: No supraclavicular or axillary adenopathy.  Skin:    General: Skin is warm.     Capillary Refill: Capillary refill takes less than 2 seconds.     Coloration: Skin is not ashen.     Findings: No rash.  Neurological:     General: No focal deficit present.     Mental Status: He is alert and oriented to person, place, and time.     Cranial Nerves: Cranial nerves are intact. No cranial nerve deficit.     Sensory: Sensation is intact.     Motor: Motor function is intact.     Deep Tendon Reflexes: Reflexes are normal and symmetric.     Reflex Scores:      Tricep reflexes are 2+ on the right side and 2+ on the left side.      Bicep reflexes are 2+ on the right side and 2+ on the left side.      Brachioradialis reflexes are 2+ on the right side and 2+ on the left side.      Patellar reflexes are 2+ on the right side and 2+ on the left side.      Achilles reflexes are 2+ on the right side and 2+ on the left side. Psychiatric:        Attention and Perception: Attention and perception normal.        Mood and Affect: Mood and affect normal.        Speech: Speech normal.        Behavior: Behavior normal. Behavior is cooperative.        Thought Content: Thought content normal.        Cognition and Memory: Cognition normal.     Wt Readings from Last 3 Encounters:  01/16/20 221 lb (100.2 kg)  07/19/19 222 lb (100.7 kg)  11/01/18 222 lb (100.7 kg)    BP 120/62   Pulse 60   Ht 6\' 2"  (1.88 m)   Wt 221 lb (100.2 kg)   BMI 28.37  kg/m   Assessment and Plan: 1. Annual physical exam No subjective/objective concerns noted during history and physical exam.  Patient's previous chart was reviewed with no concerns noted in the most recent clinic encounters, most recent labs, nor most recent imaging or care everywhere.  Labs were obtained lipid panel and renal panel as well as CBC.  Guaiac was noted to be negative.Ralph Ashley is a 34 y.o. male who presents today for his Complete Annual Exam. He feels well. He reports exercising . He reports he is sleeping well. Immunizations are reviewed and recommendations provided.   Age appropriate screening tests are discussed. Counseling given for risk factor reduction interventions. - Lipid Panel With LDL/HDL Ratio - Renal Function Panel - CBC with Differential/Platelet - POCT occult blood stool  2. Colon cancer screening DRE was negative and guaiac was also noted to be negative. - POCT occult blood stool

## 2020-01-17 LAB — CBC WITH DIFFERENTIAL/PLATELET
Basophils Absolute: 0.1 10*3/uL (ref 0.0–0.2)
Basos: 1 %
EOS (ABSOLUTE): 0.2 10*3/uL (ref 0.0–0.4)
Eos: 3 %
Hematocrit: 48 % (ref 37.5–51.0)
Hemoglobin: 16.4 g/dL (ref 13.0–17.7)
Immature Grans (Abs): 0 10*3/uL (ref 0.0–0.1)
Immature Granulocytes: 1 %
Lymphocytes Absolute: 1.8 10*3/uL (ref 0.7–3.1)
Lymphs: 29 %
MCH: 30.9 pg (ref 26.6–33.0)
MCHC: 34.2 g/dL (ref 31.5–35.7)
MCV: 91 fL (ref 79–97)
Monocytes Absolute: 0.5 10*3/uL (ref 0.1–0.9)
Monocytes: 7 %
Neutrophils Absolute: 3.8 10*3/uL (ref 1.4–7.0)
Neutrophils: 59 %
Platelets: 137 10*3/uL — ABNORMAL LOW (ref 150–450)
RBC: 5.3 x10E6/uL (ref 4.14–5.80)
RDW: 12.3 % (ref 11.6–15.4)
WBC: 6.3 10*3/uL (ref 3.4–10.8)

## 2020-01-17 LAB — RENAL FUNCTION PANEL
Albumin: 4.5 g/dL (ref 4.0–5.0)
BUN/Creatinine Ratio: 14 (ref 9–20)
BUN: 12 mg/dL (ref 6–20)
CO2: 26 mmol/L (ref 20–29)
Calcium: 9.7 mg/dL (ref 8.7–10.2)
Chloride: 104 mmol/L (ref 96–106)
Creatinine, Ser: 0.84 mg/dL (ref 0.76–1.27)
GFR calc Af Amer: 131 mL/min/{1.73_m2} (ref >59)
GFR calc non Af Amer: 113 mL/min/{1.73_m2} (ref >59)
Glucose: 89 mg/dL (ref 65–99)
Phosphorus: 3.1 mg/dL (ref 2.8–4.1)
Potassium: 4.8 mmol/L (ref 3.5–5.2)
Sodium: 142 mmol/L (ref 134–144)

## 2020-01-17 LAB — LIPID PANEL WITH LDL/HDL RATIO
Cholesterol, Total: 157 mg/dL (ref 100–199)
HDL: 37 mg/dL — ABNORMAL LOW (ref >39)
LDL Chol Calc (NIH): 90 mg/dL (ref 0–99)
LDL/HDL Ratio: 2.4 ratio (ref 0.0–3.6)
Triglycerides: 170 mg/dL — ABNORMAL HIGH (ref 0–149)
VLDL Cholesterol Cal: 30 mg/dL (ref 5–40)

## 2020-03-06 ENCOUNTER — Other Ambulatory Visit: Payer: Self-pay | Admitting: Family Medicine

## 2020-03-06 ENCOUNTER — Other Ambulatory Visit: Payer: Self-pay

## 2020-03-06 DIAGNOSIS — L649 Androgenic alopecia, unspecified: Secondary | ICD-10-CM

## 2020-03-06 MED ORDER — FINASTERIDE 1 MG PO TABS: 1.0000 mg | ORAL_TABLET | Freq: Every day | ORAL | 5 refills | Status: DC

## 2020-03-06 NOTE — Telephone Encounter (Signed)
Requested medication (s) are due for refill today: Yes  Requested medication (s) are on the active medication list: Yes  Last refill:  11/01/18  Future visit scheduled: No  Notes to clinic:  Prescription has expired.    Requested Prescriptions  Pending Prescriptions Disp Refills   finasteride (PROPECIA) 1 MG tablet 30 tablet 11    Sig: Take 1 tablet (1 mg total) by mouth daily.      Urology: 5-alpha Reductase Inhibitors Passed - 03/06/2020 10:58 AM      Passed - Valid encounter within last 12 months    Recent Outpatient Visits           1 month ago Annual physical exam   [facility] Duanne Limerick, MD   7 months ago Acute non-recurrent sinusitis, unspecified location   [facility] Duanne Limerick, MD   1 year ago Annual physical exam   [facility] Duanne Limerick, MD   2 years ago Annual physical exam   [facility] Duanne Limerick, MD   3 years ago Annual physical exam   [facility] Duanne Limerick, MD

## 2020-03-06 NOTE — Telephone Encounter (Signed)
Medication Refill - Medication: finasteride (PROPECIA) 1 MG tablet [[id number]]      Preferred Pharmacy (with phone number or street name):  [id number] [place] - [address] AT [facility] OF [address]  [address] [place] [address]  Phone: [phone] Fax: [phone]  Hours: Not open 24 hours     Agent: Please be advised that RX refills may take up to 3 business days. We ask that you follow-up with your pharmacy.

## 2020-05-18 ENCOUNTER — Other Ambulatory Visit: Payer: Self-pay

## 2020-05-18 ENCOUNTER — Ambulatory Visit
Admission: EM | Admit: 2020-05-18 | Discharge: 2020-05-18 | Disposition: A | Discharge status: Home / Self Care | Payer: BC Managed Care – PPO | Attending: Emergency Medicine | Admitting: Emergency Medicine

## 2020-05-18 ENCOUNTER — Encounter: Payer: Self-pay | Admitting: Emergency Medicine

## 2020-05-18 DIAGNOSIS — K219 Gastro-esophageal reflux disease without esophagitis: Secondary | ICD-10-CM

## 2020-05-18 DIAGNOSIS — R066 Hiccough: Secondary | ICD-10-CM | POA: Diagnosis not present

## 2020-05-18 MED ORDER — PANTOPRAZOLE SODIUM 40 MG PO TBEC: 40.0000 mg | DELAYED_RELEASE_TABLET | Freq: Every day | ORAL | 0 refills | Status: DC

## 2020-05-18 MED ORDER — METOCLOPRAMIDE HCL 10 MG PO TABS: 10.0000 mg | ORAL_TABLET | Freq: Three times a day (TID) | ORAL | 0 refills | Status: DC | PRN

## 2020-05-18 NOTE — ED Triage Notes (Signed)
Patient states he has had the hiccups since Thursday.

## 2020-05-18 NOTE — Discharge Instructions (Signed)
Restart your pantoprazole and take daily, as reflux can be a source of hiccups.  I have also sent a medication which has an off-label use of hiccup treatment, which you can try as needed. This wouldn't be a long term use medication.  If symptoms worsen or do not improve in the next week to return to be seen or to follow up with your PCP.

## 2020-05-18 NOTE — ED Provider Notes (Signed)
[facility]    CSN: [id number] Arrival date & time: 05/18/20  0818      History   Chief Complaint Chief Complaint  Patient presents with  . Hiccups    HPI Ralph Ashley is a 34 y.o. male.   Bunnie Pion presents with complaints of hiccups, which started 11/18. They have not been constant but have been recurrent. Worse after eating, today started after drinking coffee. Has been placed on PPI in the past due to cough which was suspected to be caused by reflux. He did not refill this so hasn't been taking any reflux medications. He states he does regularly have to cough after eating. No other chest pain or shortness of breath.   ROS per HPI, negative if not otherwise mentioned.       Past Medical History:  Diagnosis Date  . Allergy   . Sleep apnea    uses cpap    Patient Active Problem List   Diagnosis Date Noted  . Annual physical exam 09/29/2015    History reviewed. No pertinent surgical history.     Home Medications    Prior to Admission medications   Medication Sig Start Date End Date Taking? Authorizing Provider  finasteride (PROPECIA) 1 MG tablet Take 1 tablet (1 mg total) by mouth daily. 03/06/20  Yes Duanne Limerick, MD  metoCLOPramide (REGLAN) 10 MG tablet Take 1 tablet (10 mg total) by mouth every 8 (eight) hours as needed (hiccups). 05/18/20   Georgetta Haber, NP  pantoprazole (PROTONIX) 40 MG tablet Take 1 tablet (40 mg total) by mouth daily. 05/18/20   Linus Mako B, NP  sildenafil (REVATIO) 20 MG tablet Take 1 tablet (20 mg total) by mouth as needed. online 01/16/20   Duanne Limerick, MD  Triamcinolone Acetonide (NASACORT ALLERGY 24HR NA) Place 1 spray into the nose daily. Patient not taking: Reported on 01/16/2020    [provider]    Family History Family History  Problem Relation Age of Onset  . Cancer Paternal Grandfather   . Stroke Paternal Grandfather     Social History Social History   Tobacco Use    . Smoking status: Never Smoker  . Smokeless tobacco: Never Used  Substance Use Topics  . Alcohol use: Yes    Alcohol/week: 0.0 standard drinks  . Drug use: No     Allergies   Sulfa antibiotics   Review of Systems Review of Systems   Physical Exam Triage Vital Signs ED Triage Vitals  Enc Vitals Group     BP 05/18/20 0837 97/65     Pulse Rate 05/18/20 0835 76     Resp 05/18/20 0835 18     Temp 05/18/20 0835 98.2 F (36.8 C)     Temp Source 05/18/20 0835 Oral     SpO2 05/18/20 0835 100 %     Weight 05/18/20 0834 220 lb 14.4 oz (100.2 kg)     Height 05/18/20 0834 6\' 2"  (1.88 m)     Head Circumference --      Peak Flow --      Pain Score 05/18/20 0834 0     Pain Loc --      Pain Edu? --      Excl. in GC? --    No data found.  Updated Vital Signs BP 97/65   Pulse 76   Temp 98.2 F (36.8 C) (Oral)   Resp 18   Ht 6\' 2"  (1.88 m)   Wt  220 lb 14.4 oz (100.2 kg)   SpO2 100%   BMI 28.36 kg/m   Visual Acuity Right Eye Distance:   Left Eye Distance:   Bilateral Distance:    Right Eye Near:   Left Eye Near:    Bilateral Near:     Physical Exam Constitutional:      Appearance: He is well-developed.  Cardiovascular:     Rate and Rhythm: Normal rate.  Pulmonary:     Effort: Pulmonary effort is normal.     Comments: Hiccups noted  Skin:    General: Skin is warm and dry.  Neurological:     Mental Status: He is alert and oriented to person, place, and time.      UC Treatments / Results  Labs (all labs ordered are listed, but only abnormal results are displayed) Labs Reviewed - No data to display  EKG   Radiology No results found.  Procedures Procedures (including critical care time)  Medications Ordered in UC Medications - No data to display  Initial Impression / Assessment and Plan / UC Course  I have reviewed the triage vital signs and the nursing notes.  Pertinent labs & imaging results that were available during my care of the patient  were reviewed by me and considered in my medical decision making (see chart for details).     Hiccups which started 4 days ago and have been persistent. protonix refilled and recommended as it does seem as though reflux is likely contributing. reglan also provided to try to abort symptoms. Return precautions provided. Patient verbalized understanding and agreeable to plan.   Final Clinical Impressions(s) / UC Diagnoses   Final diagnoses:  Hiccups     Discharge Instructions     Restart your pantoprazole and take daily, as reflux can be a source of hiccups.  I have also sent a medication which has an off-label use of hiccup treatment, which you can try as needed. This wouldn't be a long term use medication.  If symptoms worsen or do not improve in the next week to return to be seen or to follow up with your PCP.    ED Prescriptions    Medication Sig Dispense Auth. Provider   pantoprazole (PROTONIX) 40 MG tablet Take 1 tablet (40 mg total) by mouth daily. 30 tablet Linus Mako B, NP   metoCLOPramide (REGLAN) 10 MG tablet Take 1 tablet (10 mg total) by mouth every 8 (eight) hours as needed (hiccups). 30 tablet Georgetta Haber, NP     PDMP not reviewed this encounter.   Georgetta Haber, NP 05/18/20 [phone]

## 2020-11-20 ENCOUNTER — Other Ambulatory Visit: Payer: Self-pay | Admitting: Family Medicine

## 2020-11-20 DIAGNOSIS — L649 Androgenic alopecia, unspecified: Secondary | ICD-10-CM

## 2020-11-20 NOTE — Telephone Encounter (Signed)
Requested Prescriptions  Pending Prescriptions Disp Refills  . finasteride (PROPECIA) 1 MG tablet [Pharmacy Med Name: FINASTERIDE 1MG  TABLETS] 30 tablet 5    Sig: TAKE 1 TABLET(1 MG) BY MOUTH DAILY     Urology: 5-alpha Reductase Inhibitors Passed - 11/20/2020 12:17 PM      Passed - Valid encounter within last 12 months    Recent Outpatient Visits          10 months ago Annual physical exam   [facility] 11/22/2020, MD   1 year ago Acute non-recurrent sinusitis, unspecified location   [facility], MD   2 years ago Annual physical exam   [facility], MD   3 years ago Annual physical exam   [facility], MD   4 years ago Annual physical exam   [facility], MD      Future Appointments            In 1 month Duanne Limerick, MD [facility]

## 2021-01-18 ENCOUNTER — Encounter: Payer: Self-pay | Admitting: Family Medicine

## 2021-01-18 ENCOUNTER — Other Ambulatory Visit: Payer: Self-pay

## 2021-01-18 ENCOUNTER — Ambulatory Visit (INDEPENDENT_AMBULATORY_CARE_PROVIDER_SITE_OTHER): Payer: BC Managed Care – PPO | Admitting: Family Medicine

## 2021-01-18 VITALS — BP 132/76 | HR 73 | Ht 74.0 in | Wt 223.0 lb

## 2021-01-18 DIAGNOSIS — M21241 Flexion deformity, right finger joints: Secondary | ICD-10-CM | POA: Diagnosis not present

## 2021-01-18 DIAGNOSIS — Z0000 Encounter for general adult medical examination without abnormal findings: Secondary | ICD-10-CM | POA: Diagnosis not present

## 2021-01-18 LAB — HEMOCCULT GUIAC POC 1CARD (OFFICE): Fecal Occult Blood, POC: NEGATIVE

## 2021-01-18 NOTE — Progress Notes (Signed)
Date:  01/18/2021   Name:  Ralph Ashley   DOB:  [birth year]   MRN:  [id number]   Chief Complaint: Annual Exam  Patient is a 35 year old male who presents for a comprehensive physical exam. The patient reports the following problems: none. Health maintenance has been reviewed up to date     Lab Results  Component Value Date   CREATININE 0.84 01/16/2020   BUN 12 01/16/2020   NA 142 01/16/2020   K 4.8 01/16/2020   CL 104 01/16/2020   CO2 26 01/16/2020   Lab Results  Component Value Date   CHOL 157 01/16/2020   HDL 37 (L) 01/16/2020   LDLCALC 90 01/16/2020   TRIG 170 (H) 01/16/2020   CHOLHDL 4.4 11/01/2018   No results found for: TSH No results found for: HGBA1C Lab Results  Component Value Date   WBC 6.3 01/16/2020   HGB 16.4 01/16/2020   HCT 48.0 01/16/2020   MCV 91 01/16/2020   PLT 137 (L) 01/16/2020   No results found for: ALT, AST, GGT, ALKPHOS, BILITOT   Review of Systems  Constitutional:  Negative for chills and fever.  HENT:  Negative for drooling, ear discharge, ear pain and sore throat.   Respiratory:  Negative for cough, shortness of breath and wheezing.   Cardiovascular:  Negative for chest pain, palpitations and leg swelling.  Gastrointestinal:  Negative for abdominal pain, blood in stool, constipation, diarrhea and nausea.  Endocrine: Negative for polydipsia.  Genitourinary:  Negative for dysuria, frequency, hematuria and urgency.  Musculoskeletal:  Positive for arthralgias. Negative for back pain, myalgias and neck pain.  Skin:  Negative for rash.  Allergic/Immunologic: Negative for environmental allergies.  Neurological:  Negative for dizziness and headaches.  Hematological:  Does not bruise/bleed easily.  Psychiatric/Behavioral:  Negative for suicidal ideas. The patient is not nervous/anxious.    Patient Active Problem List   Diagnosis Date Noted   Annual physical exam 09/29/2015    Allergies  Allergen Reactions   Sulfa Antibiotics      History reviewed. No pertinent surgical history.  Social History   Tobacco Use   Smoking status: Never   Smokeless tobacco: Never  Substance Use Topics   Alcohol use: Yes    Alcohol/week: 0.0 standard drinks   Drug use: No     Medication list has been reviewed and updated.  Current Meds  Medication Sig   finasteride (PROPECIA) 1 MG tablet TAKE 1 TABLET(1 MG) BY MOUTH DAILY   metoCLOPramide (REGLAN) 10 MG tablet Take 1 tablet (10 mg total) by mouth every 8 (eight) hours as needed (hiccups).   pantoprazole (PROTONIX) 40 MG tablet Take 1 tablet (40 mg total) by mouth daily.   sildenafil (REVATIO) 20 MG tablet Take 1 tablet (20 mg total) by mouth as needed. online    PHQ 2/9 Scores 01/18/2021 01/16/2020 07/19/2019 10/30/2017  PHQ - 2 Score 0 0 0 0  PHQ- 9 Score 0 0 0 0    GAD 7 : Generalized Anxiety Score 01/18/2021 01/16/2020 07/19/2019  Nervous, Anxious, on Edge 0 0 0  Control/stop worrying 0 0 0  Worry too much - different things 0 0 0  Trouble relaxing 0 0 0  Restless 0 0 0  Easily annoyed or irritable 0 0 0  Afraid - awful might happen 0 0 0  Total GAD 7 Score 0 0 0  Anxiety Difficulty Not difficult at all - -    BP Readings from Last  3 Encounters:  01/18/21 132/76  05/18/20 97/65  01/16/20 120/62    Physical Exam Vitals and nursing note reviewed.  Constitutional:      Appearance: Normal appearance. He is well-developed and normal weight.  HENT:     Head: Normocephalic.     Jaw: There is normal jaw occlusion.     Salivary Glands: Right salivary gland is not diffusely enlarged or tender. Left salivary gland is not diffusely enlarged or tender.     Right Ear: Hearing, tympanic membrane, ear canal and external ear normal. No decreased hearing noted. There is no impacted cerumen.     Left Ear: Hearing, tympanic membrane, ear canal and external ear normal. No decreased hearing noted. There is no impacted cerumen.     Nose: Nose normal. No congestion or rhinorrhea.      Right Turbinates: Not swollen.     Left Turbinates: Not swollen.     Mouth/Throat:     Lips: Pink.     Mouth: Mucous membranes are moist.     Dentition: Normal dentition.     Tongue: No lesions.     Palate: No mass.     Pharynx: Oropharynx is clear. Uvula midline. No pharyngeal swelling, oropharyngeal exudate or posterior oropharyngeal erythema.  Eyes:     General: Lids are normal. Vision grossly intact. Gaze aligned appropriately. No scleral icterus.       Right eye: No discharge.        Left eye: No discharge.     Extraocular Movements: Extraocular movements intact.     Conjunctiva/sclera: Conjunctivae normal.     Pupils: Pupils are equal, round, and reactive to light.     Funduscopic exam:    Right eye: Red reflex present.        Left eye: Red reflex present. Neck:     Thyroid: No thyroid mass, thyromegaly or thyroid tenderness.     Vascular: Normal carotid pulses. No carotid bruit, hepatojugular reflux or JVD.     Trachea: Trachea and phonation normal. No tracheal deviation.  Cardiovascular:     Rate and Rhythm: Normal rate and regular rhythm.     Chest Wall: PMI is not displaced.     Pulses: Normal pulses. No decreased pulses.          Carotid pulses are 2+ on the right side and 2+ on the left side.      Radial pulses are 2+ on the right side and 2+ on the left side.       Femoral pulses are 2+ on the right side and 2+ on the left side.      Popliteal pulses are 2+ on the right side and 2+ on the left side.       Dorsalis pedis pulses are 2+ on the right side and 2+ on the left side.       Posterior tibial pulses are 2+ on the right side and 2+ on the left side.     Heart sounds: Normal heart sounds, S1 normal and S2 normal. No murmur heard. No systolic murmur is present.  No diastolic murmur is present.    No friction rub. No gallop. No S3 or S4 sounds.  Pulmonary:     Effort: Pulmonary effort is normal. No respiratory distress.     Breath sounds: Normal breath  sounds. No decreased air movement or transmitted upper airway sounds. No decreased breath sounds, wheezing, rhonchi or rales.  Chest:  Breasts:    Breasts are symmetrical.  Right: Normal. No mass, axillary adenopathy or supraclavicular adenopathy.     Left: Normal. No mass, axillary adenopathy or supraclavicular adenopathy.  Abdominal:     General: Abdomen is flat. Bowel sounds are normal.     Palpations: Abdomen is soft. There is no hepatomegaly, splenomegaly or mass.     Tenderness: There is no abdominal tenderness. There is no guarding or rebound.     Hernia: There is no hernia in the umbilical area, ventral area, left inguinal area or right inguinal area.  Genitourinary:    Penis: Circumcised.      Testes: Normal.        Right: Mass, tenderness or swelling not present.        Left: Mass, tenderness or swelling not present.     Epididymis:     Right: Normal.     Left: Normal.     Prostate: Normal. Not enlarged, not tender and no nodules present.     Rectum: Normal. Guaiac result negative. No mass.  Musculoskeletal:        General: No tenderness.     Right hand: Deformity present. Decreased range of motion.     Cervical back: Normal range of motion and neck supple.     Right lower leg: No edema.     Left lower leg: No edema.  Lymphadenopathy:     Head:     Right side of head: No submental, submandibular or tonsillar adenopathy.     Left side of head: No submental, submandibular or tonsillar adenopathy.     Cervical: No cervical adenopathy.     Right cervical: No superficial, deep or posterior cervical adenopathy.    Left cervical: No superficial, deep or posterior cervical adenopathy.     Upper Body:     Right upper body: No supraclavicular or axillary adenopathy.     Left upper body: No supraclavicular or axillary adenopathy.     Lower Body: No right inguinal adenopathy. No left inguinal adenopathy.  Skin:    General: Skin is warm.     Capillary Refill: Capillary refill  takes less than 2 seconds.     Findings: No rash.  Neurological:     Mental Status: He is alert and oriented to person, place, and time.     Cranial Nerves: Cranial nerves are intact. No cranial nerve deficit or facial asymmetry.     Sensory: Sensation is intact.     Motor: Motor function is intact.     Deep Tendon Reflexes: Reflexes are normal and symmetric.     Reflex Scores:      Tricep reflexes are 2+ on the right side and 2+ on the left side.      Bicep reflexes are 2+ on the right side and 2+ on the left side.      Brachioradialis reflexes are 2+ on the right side and 2+ on the left side.      Patellar reflexes are 2+ on the right side and 2+ on the left side.      Achilles reflexes are 2+ on the right side and 2+ on the left side. Psychiatric:        Behavior: Behavior is cooperative.    Wt Readings from Last 3 Encounters:  01/18/21 223 lb (101.2 kg)  05/18/20 220 lb 14.4 oz (100.2 kg)  01/16/20 221 lb (100.2 kg)    BP 132/76 (BP Location: Right Arm, Patient Position: Sitting, Cuff Size: Normal)   Pulse 73   Ht 6\' 2"  (  1.88 m)   Wt 223 lb (101.2 kg)   SpO2 96%   BMI 28.63 kg/m   Assessment and Plan:  Ralph Ashley is a 35 y.o. male who presents today for his Complete Annual Exam. He feels well. He reports exercising . He reports he is sleeping well.  Patient's chart was reviewed for previous encounters most recent labs most recent imaging in Care Everywhere.  1. Annual physical exam Immunizations are reviewed and recommendations provided.   Age appropriate screening tests are discussed. Counseling given for risk factor reduction interventions.  No subjective/objective on history and physical exam and no other Concerns elicited from the review of systems other than concern of finger below..  We will obtain labs including renal function panel lipid panel and will screen for occult blood in the stool. - Renal Function Panel - Lipid Panel With LDL/HDL Ratio - POCT Occult  Blood Stool  2. Flexion deformity of finger, right Patient continues to have a decreased flexion of the right index finger which did not respond to occupational therapy/physical therapy.  Patient will have an upcoming evaluation with Dr. Ashley RoyaltyMatthews with likely prior x-ray. - DG Finger Index Right; Future

## 2021-01-19 LAB — LIPID PANEL WITH LDL/HDL RATIO
Cholesterol, Total: 141 mg/dL (ref 100–199)
HDL: 33 mg/dL — ABNORMAL LOW (ref >39)
LDL Chol Calc (NIH): 80 mg/dL (ref 0–99)
LDL/HDL Ratio: 2.4 ratio (ref 0.0–3.6)
Triglycerides: 160 mg/dL — ABNORMAL HIGH (ref 0–149)
VLDL Cholesterol Cal: 28 mg/dL (ref 5–40)

## 2021-01-19 LAB — RENAL FUNCTION PANEL
Albumin: 4.5 g/dL (ref 4.0–5.0)
BUN/Creatinine Ratio: 13 (ref 9–20)
BUN: 12 mg/dL (ref 6–20)
CO2: 27 mmol/L (ref 20–29)
Calcium: 9.2 mg/dL (ref 8.7–10.2)
Chloride: 104 mmol/L (ref 96–106)
Creatinine, Ser: 0.94 mg/dL (ref 0.76–1.27)
Glucose: 93 mg/dL (ref 65–99)
Phosphorus: 3 mg/dL (ref 2.8–4.1)
Potassium: 4.5 mmol/L (ref 3.5–5.2)
Sodium: 140 mmol/L (ref 134–144)
eGFR: 108 mL/min/{1.73_m2} (ref >59)

## 2021-01-25 ENCOUNTER — Ambulatory Visit (INDEPENDENT_AMBULATORY_CARE_PROVIDER_SITE_OTHER): Payer: BC Managed Care – PPO | Admitting: Family Medicine

## 2021-01-25 ENCOUNTER — Ambulatory Visit
Admission: RE | Admit: 2021-01-25 | Discharge: 2021-01-25 | Disposition: A | Discharge status: Home / Self Care | Payer: BC Managed Care – PPO | Attending: Family Medicine | Admitting: Family Medicine

## 2021-01-25 ENCOUNTER — Encounter: Payer: Self-pay | Admitting: Family Medicine

## 2021-01-25 ENCOUNTER — Ambulatory Visit
Admission: RE | Admit: 2021-01-25 | Discharge: 2021-01-25 | Disposition: A | Discharge status: Home / Self Care | Payer: BC Managed Care – PPO | Source: Ambulatory Visit | Attending: Family Medicine | Admitting: Family Medicine

## 2021-01-25 ENCOUNTER — Other Ambulatory Visit: Payer: Self-pay

## 2021-01-25 VITALS — BP 118/70 | HR 75 | Temp 97.7°F | Ht 74.0 in | Wt 223.0 lb

## 2021-01-25 DIAGNOSIS — M21241 Flexion deformity, right finger joints: Secondary | ICD-10-CM | POA: Diagnosis not present

## 2021-01-25 DIAGNOSIS — M20091 Other deformity of right finger(s): Secondary | ICD-10-CM | POA: Diagnosis not present

## 2021-01-25 DIAGNOSIS — R29898 Other symptoms and signs involving the musculoskeletal system: Secondary | ICD-10-CM | POA: Diagnosis not present

## 2021-01-25 NOTE — Progress Notes (Signed)
Primary Care / Sports Medicine Office Visit  Patient Information:  Patient ID: Ralph Ashley, male DOB: Oct 20, 1985 Age: 35 y.o. MRN: [id number]   Ralph Ashley is a pleasant 35 y.o. male presenting with the following:  Chief Complaint  Patient presents with   New Patient (Initial Visit)   Flexion deformity of finger, right    X2 years; no known injury; Physical Therapy and Occupational Therapy about 1.5 years ago ineffective; X-Ray 01/25/21; affecting gripping; right-handedness; denies pain in office    Review of Systems pertinent details above   Patient Active Problem List   Diagnosis Date Noted   Right hand weakness 01/25/2021   Annual physical exam 09/29/2015   Past Medical History:  Diagnosis Date   Allergy    Sleep apnea    uses cpap   Outpatient Encounter Medications as of 01/25/2021  Medication Sig   Ascorbic Acid (VITAMIN C) 1000 MG tablet Take 1,000 mg by mouth daily.   Cholecalciferol (VITAMIN D) 50 MCG (2000 UT) CAPS Take 2,000 Units by mouth daily.   finasteride (PROPECIA) 1 MG tablet TAKE 1 TABLET(1 MG) BY MOUTH DAILY   sildenafil (REVATIO) 20 MG tablet Take 1 tablet (20 mg total) by mouth as needed. online   zinc gluconate 50 MG tablet Take 50 mg by mouth daily.   [DISCONTINUED] metoCLOPramide (REGLAN) 10 MG tablet Take 1 tablet (10 mg total) by mouth every 8 (eight) hours as needed (hiccups).   [DISCONTINUED] pantoprazole (PROTONIX) 40 MG tablet Take 1 tablet (40 mg total) by mouth daily.   No facility-administered encounter medications on file as of 01/25/2021.   History reviewed. No pertinent surgical history.  Vitals:   01/25/21 0834  BP: 118/70  Pulse: 75  Temp: 97.7 F (36.5 C)  SpO2: 97%   Vitals:   01/25/21 0834  Weight: 223 lb (101.2 kg)  Height: 6\' 2"  (1.88 m)   Body mass index is 28.63 kg/m.  No results found.   Independent interpretation of notes and tests performed by another provider:   Independent interpretation of right  index finger x-ray dated 01/25/2021 reveals no overt cortical irregularity, soft tissue swelling, or malalignment, no acute osseous process identified  Procedures performed:   None  Pertinent History, Exam, Impression, and Recommendations:   Right hand weakness Right-hand-dominant 35 year old with roughly 2-3 years of noted weakness primarily involving flexion of digit 2 and to a lesser extent digit 3.  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  He is uncertain about progression over this time period.  Denies any swelling, skin discoloration, does note deformity (inability to fully form a closed fist).  He denies any paresthesias but has significant issues with completing his activities of daily living due to this issue.  Physical exam reveals full 5/5 strength extension through all digits and in particular at the MCP, PIP, and DIP of digits 2 and 3.  During resisted flexion at the PIP, there is weakness 5 -/5 at digits 4 and 5, 3/5 strength at digits 2 and 3, thumb without issue.  During resisted flexion at the DIP, 5/5 strength at digits 4 and 5, 4/5 strength at digit 3, and 4 -/5 strength at digit 2, thumb without issue.  Sensorimotor function otherwise intact in the radial, median, and ulnar nerve distributions.  Contralateral hand examination benign.  Clinical and radiographic findings raise concern for flexor tendon dysfunction primarily involving digits 2, to a lesser extent  digit 3.  Given the chronicity of the symptoms, hand dominance, disruption of ADLs, prior trial of occupational therapy, and home exercises, plan for MRI right hand without contrast to further evaluate soft tissue derangement.  The patient is to return to the office to review the results and determine next steps both surgical and nonsurgical pending findings.  He vocalizes understanding and is amenable to this plan moving forward.    Orders & Medications No  orders of the defined types were placed in this encounter.  Orders Placed This Encounter  Procedures   MR HAND RIGHT WO CONTRAST     No follow-ups on file.     Jerrol Banana, MD   Primary Care Sports Medicine [facility]

## 2021-01-25 NOTE — Patient Instructions (Signed)
-   Coordinator will contact you for MRI scheduling - Once scheduled, contact our office to coordinate a follow-up visit - Continue with activity as tolerated - Contact for any questions

## 2021-01-25 NOTE — Assessment & Plan Note (Signed)
Right-hand-dominant 35 year old with roughly 2-3 years of noted weakness primarily involving flexion of digit 2 and to a lesser extent digit 3.  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  He is uncertain about progression over this time period.  Denies any swelling, skin discoloration, does note deformity (inability to fully form a closed fist).  He denies any paresthesias but has significant issues with completing his activities of daily living due to this issue.  Physical exam reveals full 5/5 strength extension through all digits and in particular at the MCP, PIP, and DIP of digits 2 and 3.  During resisted flexion at the PIP, there is weakness 5 -/5 at digits 4 and 5, 3/5 strength at digits 2 and 3, thumb without issue.  During resisted flexion at the DIP, 5/5 strength at digits 4 and 5, 4/5 strength at digit 3, and 4 -/5 strength at digit 2, thumb without issue.  Sensorimotor function otherwise intact in the radial, median, and ulnar nerve distributions.  Contralateral hand examination benign.  Clinical and radiographic findings raise concern for flexor tendon dysfunction primarily involving digits 2, to a lesser extent digit 3.  Given the chronicity of the symptoms, hand dominance, disruption of ADLs, prior trial of occupational therapy, and home exercises, plan for MRI right hand without contrast to further evaluate soft tissue derangement.  The patient is to return to the office to review the results and determine next steps both surgical and nonsurgical pending findings.  He vocalizes understanding and is amenable to this plan moving forward.

## 2021-02-08 ENCOUNTER — Telehealth: Payer: Self-pay

## 2021-02-08 NOTE — Telephone Encounter (Signed)
Patient's prior auth for MRI right hand was denied. A peer to peer can be done. Provider review: [phone] option 1, 1, 2.

## 2021-02-16 ENCOUNTER — Other Ambulatory Visit: Payer: Self-pay | Admitting: Family Medicine

## 2021-02-16 DIAGNOSIS — R29898 Other symptoms and signs involving the musculoskeletal system: Secondary | ICD-10-CM

## 2021-02-16 NOTE — Progress Notes (Signed)
    Primary Care / Sports Medicine Telephone Note  Patient Information:  Patient ID: Ralph Ashley, male DOB: 1985/10/21 Age: 35 y.o. MRN: [id number]    Spoke to patient regarding MRI denial, I have discussed methods to address this and he is amenable to further evaluation and management by orthopedic surgeon with focus on hand. A referral was placed today in this regard.  Jerrol Banana, MD   Primary Care Sports Medicine [facility]

## 2021-03-28 ENCOUNTER — Telehealth: Payer: BC Managed Care – PPO | Admitting: Nurse Practitioner

## 2021-03-28 DIAGNOSIS — R0989 Other specified symptoms and signs involving the circulatory and respiratory systems: Secondary | ICD-10-CM

## 2021-03-28 MED ORDER — AMOXICILLIN-POT CLAVULANATE 875-125 MG PO TABS: 1.0000 | ORAL_TABLET | Freq: Two times a day (BID) | ORAL | 0 refills | Status: DC

## 2021-03-28 MED ORDER — FLUTICASONE PROPIONATE 50 MCG/ACT NA SUSP: 2.0000 | Freq: Every day | NASAL | 0 refills | Status: DC

## 2021-03-28 MED ORDER — PSEUDOEPH-BROMPHEN-DM 30-2-10 MG/5ML PO SYRP: 5.0000 mL | ORAL_SOLUTION | Freq: Four times a day (QID) | ORAL | 0 refills | Status: AC | PRN

## 2021-03-28 NOTE — Progress Notes (Signed)
E-Visit for Upper Respiratory Infection   We are sorry you are not feeling well.  Here is how we plan to help!  Based on what you have shared with me, it looks like you may have a viral upper respiratory infection.  Upper respiratory infections are caused by a large number of viruses; however, rhinovirus is the most common cause. I recommend COVID testing based on your symptoms. If you have COVID, antibiotics would not help because it is a virus.  Symptoms vary from person to person, with common symptoms including sore throat, cough, fatigue or lack of energy and feeling of general discomfort.  A low-grade fever of up to 100.4 may present, but is often uncommon.  Symptoms vary however, and are closely related to a person's age or underlying illnesses.  The most common symptoms associated with an upper respiratory infection are nasal discharge or congestion, cough, sneezing, headache and pressure in the ears and face.  These symptoms usually persist for about 3 to 10 days, but can last up to 2 weeks.  It is important to know that upper respiratory infections do not cause serious illness or complications in most cases.    Upper respiratory infections can be transmitted from person to person, with the most common method of transmission being a person's hands.  The virus is able to live on the skin and can infect other persons for up to 2 hours after direct contact.  Also, these can be transmitted when someone coughs or sneezes; thus, it is important to cover the mouth to reduce this risk.  To keep the spread of the illness at bay, good hand hygiene is very important.  This is an infection that is most likely caused by a virus. There are no specific treatments other than to help you with the symptoms until the infection runs its course.  We are sorry you are not feeling well.  Here is how we plan to help!   For nasal congestion, you may use an oral decongestants such as Mucinex D or if you have glaucoma or  high blood pressure use plain Mucinex.  Saline nasal spray or nasal drops can help and can safely be used as often as needed for congestion.  For your congestion, I have prescribed Fluticasone nasal spray one spray in each nostril twice a day. I am prescribing an antibiotic, Augmentin 875/125mg , take one tablet twice daily for 10 days. I would recommend COVID testing prior to starting the antibiotic. If your COVID test is positive, do not start the antibiotic.  If you do not have a history of heart disease, hypertension, diabetes or thyroid disease, prostate/bladder issues or glaucoma, you may also use Sudafed to treat nasal congestion.  It is highly recommended that you consult with a pharmacist or your primary care physician to ensure this medication is safe for you to take.     If you have a cough, you may use cough suppressants such as Delsym and Robitussin.  If you have glaucoma or high blood pressure, you can also use Coricidin HBP.   For cough I have prescribed for you Bromfed cough syrup. Take 72mL every 6 hours as needed for cough.   If you have a sore or scratchy throat, use a saltwater gargle-  to  teaspoon of salt dissolved in a 4-ounce to 8-ounce glass of warm water.  Gargle the solution for approximately 15-30 seconds and then spit.  It is important not to swallow the solution.  You can  also use throat lozenges/cough drops and Chloraseptic spray to help with throat pain or discomfort.  Warm or cold liquids can also be helpful in relieving throat pain.  For headache, pain or general discomfort, you can use Ibuprofen or Tylenol as directed.   Some authorities believe that zinc sprays or the use of Echinacea may shorten the course of your symptoms.   HOME CARE Only take medications as instructed by your medical team. Be sure to drink plenty of fluids. Water is fine as well as fruit juices, sodas and electrolyte beverages. You may want to stay away from caffeine or alcohol. If you are  nauseated, try taking small sips of liquids. How do you know if you are getting enough fluid? Your urine should be a pale yellow or almost colorless. Get rest. Taking a steamy shower or using a humidifier may help nasal congestion and ease sore throat pain. You can place a towel over your head and breathe in the steam from hot water coming from a faucet. Using a saline nasal spray works much the same way. Cough drops, hard candies and sore throat lozenges may ease your cough. Avoid close contacts especially the very young and the elderly Cover your mouth if you cough or sneeze Always remember to wash your hands.    GET HELP RIGHT AWAY IF: You develop worsening fever. If your symptoms do not improve within 10 days You develop yellow or green discharge from your nose over 3 days. You have coughing fits You develop a severe head ache or visual changes. You develop shortness of breath, difficulty breathing or start having chest pain Your symptoms persist after you have completed your treatment plan  MAKE SURE YOU  Understand these instructions. Will watch your condition. Will get help right away if you are not doing well or get worse.  Thank you for choosing an e-visit.  Your e-visit answers were reviewed by a board certified advanced clinical practitioner to complete your personal care plan. Depending upon the condition, your plan could have included both over the counter or prescription medications.  Please review your pharmacy choice. Make sure the pharmacy is open so you can pick up prescription now. If there is a problem, you may contact your provider through [employer] and have the prescription routed to another pharmacy.  Your safety is important to [place]. If you have drug allergies check your prescription carefully.   For the next 24 hours you can use MyChart to ask questions about today's visit, request a non-urgent call back, or ask for a work or school excuse. You will get  an email in the next two days asking about your experience. I hope that your e-visit has been valuable and will speed your recovery.  I have spent at least 5 minutes reviewing and documenting in the patient's chart.

## 2021-03-29 DIAGNOSIS — M65321 Trigger finger, right index finger: Secondary | ICD-10-CM | POA: Diagnosis not present

## 2021-08-15 DIAGNOSIS — J069 Acute upper respiratory infection, unspecified: Secondary | ICD-10-CM | POA: Diagnosis not present

## 2021-09-14 ENCOUNTER — Telehealth: Payer: Self-pay

## 2021-09-14 NOTE — Telephone Encounter (Signed)
-----   Message from Everitt Amber sent at 09/14/2021 11:44 AM EDT ----- ?Pt needs appt for med refill- we are receiving requests for meds ? ?

## 2021-09-17 ENCOUNTER — Telehealth: Payer: Self-pay

## 2021-09-17 ENCOUNTER — Other Ambulatory Visit: Payer: Self-pay

## 2021-09-17 DIAGNOSIS — L649 Androgenic alopecia, unspecified: Secondary | ICD-10-CM

## 2021-09-17 MED ORDER — FINASTERIDE 1 MG PO TABS: ORAL_TABLET | ORAL | 0 refills | Status: DC

## 2021-09-17 NOTE — Telephone Encounter (Signed)
Pt wants to speak to Delice Bison, denied appt offer. [phone] ?

## 2021-09-17 NOTE — Telephone Encounter (Signed)
Returned pt's call- sent in Finasteride for 30 days with 0 refills. Pt needs to be seen in April for med refill. Offered to schedule appt- he denied the appt.  ?

## 2022-01-19 ENCOUNTER — Telehealth: Payer: Self-pay

## 2022-01-19 NOTE — Telephone Encounter (Signed)
Tried to call pt- "mailbox is full." I left the appointment, for Dr. Landry Mellow, with wife Marchelle Folks. It is going to be Monday July 31st @ 3:00 in [employer]- follow up hand weakness s/p Menz visit.

## 2022-01-21 ENCOUNTER — Ambulatory Visit (INDEPENDENT_AMBULATORY_CARE_PROVIDER_SITE_OTHER): Payer: BC Managed Care – PPO | Admitting: Family Medicine

## 2022-01-21 ENCOUNTER — Encounter: Payer: Self-pay | Admitting: Family Medicine

## 2022-01-21 VITALS — BP 110/70 | HR 80 | Ht 74.0 in | Wt 230.0 lb

## 2022-01-21 DIAGNOSIS — N529 Male erectile dysfunction, unspecified: Secondary | ICD-10-CM

## 2022-01-21 DIAGNOSIS — L649 Androgenic alopecia, unspecified: Secondary | ICD-10-CM

## 2022-01-21 DIAGNOSIS — Z0000 Encounter for general adult medical examination without abnormal findings: Secondary | ICD-10-CM | POA: Diagnosis not present

## 2022-01-21 DIAGNOSIS — N500 Atrophy of testis: Secondary | ICD-10-CM

## 2022-01-21 DIAGNOSIS — M21241 Flexion deformity, right finger joints: Secondary | ICD-10-CM | POA: Diagnosis not present

## 2022-01-21 LAB — BASIC METABOLIC PANEL
BUN: 11 (ref 4–21)
CO2: 25 — AB (ref 13–22)
Chloride: 103 (ref 99–108)
Creatinine: 0.9 (ref 0.6–1.3)
Glucose: 90
Potassium: 4.7 mEq/L (ref 3.5–5.1)
Sodium: 143 (ref 137–147)

## 2022-01-21 LAB — COMPREHENSIVE METABOLIC PANEL
Albumin: 4.6 (ref 3.5–5.0)
Calcium: 10.1 (ref 8.7–10.7)
Globulin: 2.4
eGFR: 111

## 2022-01-21 LAB — HEPATIC FUNCTION PANEL
ALT: 32 U/L (ref 10–40)
AST: 33 (ref 14–40)
Alkaline Phosphatase: 84 (ref 25–125)
Bilirubin, Total: 0.6

## 2022-01-21 LAB — TESTOSTERONE: Testosterone: 479

## 2022-01-21 LAB — CBC AND DIFFERENTIAL
HCT: 47 (ref 41–53)
Hemoglobin: 16 (ref 13.5–17.5)
Platelets: 149 10*3/uL — AB (ref 150–400)
WBC: 6.1

## 2022-01-21 LAB — LIPID PANEL
Cholesterol: 157 (ref 0–200)
HDL: 35 (ref 35–70)
LDL Cholesterol: 90
LDl/HDL Ratio: 2.6
Triglycerides: 187 — AB (ref 40–160)

## 2022-01-21 LAB — CBC: RBC: 5.04 (ref 3.87–5.11)

## 2022-01-21 MED ORDER — FINASTERIDE 1 MG PO TABS: ORAL_TABLET | ORAL | 1 refills | Status: DC

## 2022-01-21 MED ORDER — SILDENAFIL CITRATE 20 MG PO TABS: 20.0000 mg | ORAL_TABLET | ORAL | 5 refills | Status: DC | PRN

## 2022-01-21 NOTE — Progress Notes (Signed)
Date:  01/21/2022   Name:  Ralph Ashley   DOB:  1986/06/15   MRN:  [id number]   Chief Complaint: Annual Exam, Alopecia, and Erectile Dysfunction  Patient is a 36 year old male who presents for a comprehensive physical exam. The patient reports the following problems: none. Health maintenance has been reviewed up to date    Erectile Dysfunction This is a chronic problem. The current episode started more than 1 year ago. The problem has been waxing and waning since onset. The nature of his difficulty is achieving erection. He reports no anxiety. Irritative symptoms do not include frequency or urgency. Pertinent negatives include no chills, dysuria, genital pain, hematuria, hesitancy or inability to urinate. Past treatments include sildenafil. The treatment provided moderate relief.    Lab Results  Component Value Date   NA 140 01/18/2021   K 4.5 01/18/2021   CO2 27 01/18/2021   GLUCOSE 93 01/18/2021   BUN 12 01/18/2021   CREATININE 0.94 01/18/2021   CALCIUM 9.2 01/18/2021   EGFR 108 01/18/2021   GFRNONAA 113 01/16/2020   Lab Results  Component Value Date   CHOL 141 01/18/2021   HDL 33 (L) 01/18/2021   LDLCALC 80 01/18/2021   TRIG 160 (H) 01/18/2021   CHOLHDL 4.4 11/01/2018   No results found for: "TSH" No results found for: "HGBA1C" Lab Results  Component Value Date   WBC 6.3 01/16/2020   HGB 16.4 01/16/2020   HCT 48.0 01/16/2020   MCV 91 01/16/2020   PLT 137 (L) 01/16/2020   No results found for: "ALT", "AST", "GGT", "ALKPHOS", "BILITOT" No results found for: "25OHVITD2", "25OHVITD3", "VD25OH"   Review of Systems  Constitutional:  Negative for chills and fever.  HENT:  Negative for drooling, ear discharge, ear pain and sore throat.   Respiratory:  Negative for cough, shortness of breath and wheezing.   Cardiovascular:  Negative for chest pain, palpitations and leg swelling.  Gastrointestinal:  Negative for abdominal pain, blood in stool, constipation,  diarrhea and nausea.  Endocrine: Negative for polydipsia.  Genitourinary:  Negative for dysuria, frequency, hematuria, hesitancy and urgency.  Musculoskeletal:  Negative for back pain, myalgias and neck pain.  Skin:  Negative for rash.  Allergic/Immunologic: Negative for environmental allergies.  Neurological:  Negative for dizziness and headaches.  Hematological:  Does not bruise/bleed easily.  Psychiatric/Behavioral:  Negative for suicidal ideas. The patient is not nervous/anxious.     Patient Active Problem List   Diagnosis Date Noted   Right hand weakness 01/25/2021   Annual physical exam 09/29/2015    Allergies  Allergen Reactions   Sulfa Antibiotics Other (See Comments)    Unknown    No past surgical history on file.  Social History   Tobacco Use   Smoking status: Never   Smokeless tobacco: Never  Vaping Use   Vaping Use: Never used  Substance Use Topics   Alcohol use: Yes    Alcohol/week: 0.0 standard drinks of alcohol   Drug use: Never     Medication list has been reviewed and updated.  Current Meds  Medication Sig   finasteride (PROPECIA) 1 MG tablet TAKE 1 TABLET(1 MG) BY MOUTH DAILY   sildenafil (REVATIO) 20 MG tablet Take 1 tablet (20 mg total) by mouth as needed. online       01/21/2022    8:37 AM 01/25/2021    8:27 AM 01/18/2021    9:38 AM 01/16/2020   10:09 AM  GAD 7 : Generalized Anxiety  Score  Nervous, Anxious, on Edge 0 0 0 0  Control/stop worrying 0 0 0 0  Worry too much - different things 0 0 0 0  Trouble relaxing 0 0 0 0  Restless 0 0 0 0  Easily annoyed or irritable 0 0 0 0  Afraid - awful might happen 0 0 0 0  Total GAD 7 Score 0 0 0 0  Anxiety Difficulty Not difficult at all Not difficult at all Not difficult at all        01/21/2022    8:36 AM 01/25/2021    8:26 AM 01/18/2021    9:37 AM  Depression screen PHQ 2/9  Decreased Interest 0 0 0  Down, Depressed, Hopeless 0 0 0  PHQ - 2 Score 0 0 0  Altered sleeping 0 0 0  Tired,  decreased energy 0 0 0  Change in appetite 0 0 0  Feeling bad or failure about yourself  0 0 0  Trouble concentrating 0 0 0  Moving slowly or fidgety/restless 0 0 0  Suicidal thoughts 0 0 0  PHQ-9 Score 0 0 0  Difficult doing work/chores Not difficult at all Not difficult at all Not difficult at all    BP Readings from Last 3 Encounters:  01/21/22 110/70  01/25/21 118/70  01/18/21 132/76    Physical Exam Vitals and nursing note reviewed.  HENT:     Head: Normocephalic.     Jaw: There is normal jaw occlusion.     Right Ear: Hearing, tympanic membrane, ear canal and external ear normal. There is no impacted cerumen.     Left Ear: Hearing, tympanic membrane, ear canal and external ear normal. There is no impacted cerumen.     Nose: Nose normal. No congestion or rhinorrhea.     Mouth/Throat:     Lips: Pink.     Mouth: Mucous membranes are moist.     Palate: No mass and lesions.     Pharynx: Oropharynx is clear. No oropharyngeal exudate or posterior oropharyngeal erythema.  Eyes:     General: Lids are normal. Vision grossly intact. No scleral icterus.       Right eye: No discharge.        Left eye: No discharge.     Extraocular Movements: Extraocular movements intact.     Right eye: Normal extraocular motion.     Left eye: Normal extraocular motion.     Conjunctiva/sclera: Conjunctivae normal.     Pupils: Pupils are equal, round, and reactive to light.     Funduscopic exam:    Right eye: Red reflex present.        Left eye: Red reflex present. Neck:     Thyroid: No thyroid mass, thyromegaly or thyroid tenderness.     Vascular: Normal carotid pulses. No carotid bruit, hepatojugular reflux or JVD.     Trachea: Trachea and phonation normal. No tracheal deviation.  Cardiovascular:     Rate and Rhythm: Normal rate and regular rhythm.     Pulses:          Carotid pulses are 2+ on the right side and 2+ on the left side.      Radial pulses are 2+ on the right side and 2+ on the  left side.       Femoral pulses are 2+ on the right side and 2+ on the left side.      Popliteal pulses are 2+ on the right side and 2+ on the left  side.       Dorsalis pedis pulses are 2+ on the right side and 2+ on the left side.       Posterior tibial pulses are 2+ on the right side and 2+ on the left side.     Heart sounds: Normal heart sounds, S1 normal and S2 normal. No murmur heard.    No systolic murmur is present.     No diastolic murmur is present.     No friction rub. No gallop. No S3 or S4 sounds.  Pulmonary:     Effort: Pulmonary effort is normal. No respiratory distress.     Breath sounds: Normal breath sounds. No decreased breath sounds, wheezing, rhonchi or rales.  Chest:  Breasts:    Breasts are symmetrical.     Right: Normal. No tenderness.     Left: Normal. No tenderness.  Abdominal:     General: Bowel sounds are normal.     Palpations: Abdomen is soft. There is no mass.     Tenderness: There is no abdominal tenderness. There is no guarding or rebound.     Hernia: No hernia is present.  Genitourinary:    Penis: Normal.      Testes: Normal.        Right: Mass, tenderness or swelling not present.        Left: Mass, tenderness or swelling not present.  Musculoskeletal:        General: No tenderness. Normal range of motion.     Cervical back: Full passive range of motion without pain, normal range of motion and neck supple.     Right lower leg: No edema.     Left lower leg: No edema.  Lymphadenopathy:     Head:     Right side of head: No submandibular or tonsillar adenopathy.     Left side of head: No submandibular or tonsillar adenopathy.     Cervical: No cervical adenopathy.     Right cervical: No superficial, deep or posterior cervical adenopathy.    Left cervical: No superficial, deep or posterior cervical adenopathy.     Upper Body:     Right upper body: No supraclavicular or axillary adenopathy.     Left upper body: No supraclavicular or axillary  adenopathy.     Lower Body: No right inguinal adenopathy. No left inguinal adenopathy.  Skin:    General: Skin is warm.     Findings: No rash.  Neurological:     Mental Status: He is alert and oriented to person, place, and time.     Cranial Nerves: Cranial nerves 2-12 are intact. No cranial nerve deficit.     Sensory: Sensation is intact.     Motor: Weakness present.     Deep Tendon Reflexes: Reflexes are normal and symmetric.     Comments: Unable to flex right index dip and pip  Psychiatric:        Behavior: Behavior is cooperative.     Wt Readings from Last 3 Encounters:  01/21/22 230 lb (104.3 kg)  01/25/21 223 lb (101.2 kg)  01/18/21 223 lb (101.2 kg)    BP 110/70   Pulse 80   Ht 6' 2"  (1.88 m)   Wt 230 lb (104.3 kg)   BMI 29.53 kg/m   Assessment and Plan:  Ralph Ashley is a 36 y.o. male who presents today for his Complete Annual Exam. He feels well. He reports exercising work related. He reports he is sleeping well.  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  Controlled.  Stable.  Continue finasteride 20 mg daily. - finasteride (PROPECIA) 1 MG tablet; TAKE 1 TABLET(1 MG) BY MOUTH DAILY  Dispense: 90 tablet; Refill: 1  2. Annual physical exam Immunizations are reviewed and recommendations provided.   Age appropriate screening tests are discussed. Counseling given for risk factor reduction interventions.  No subjective/objective concerns noted during history of present illness.  Review of systems.  Or physical exam except for noted inability to fully flex right index finger..  We will obtain lipid panel CMP and CBC. - Lipid Panel With LDL/HDL Ratio - Comprehensive Metabolic Panel (CMET) - CBC w/Diff/Platelet  3. Flexion deformity of finger, right As noted above patient is unable to flex at the DIP and PIP aspect of the right index finger.  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 - Testosterone,Free and  Total  5. Vasculogenic erectile dysfunction, unspecified vasculogenic erectile dysfunction type Chronic.  Controlled.  Stable.  Continue sildenafil 20 mg as needed. - sildenafil (REVATIO) 20 MG tablet; Take 1 tablet (20 mg total) by mouth as needed. online  Dispense: 10 tablet; Refill: 5

## 2022-01-21 NOTE — Patient Instructions (Signed)

## 2022-01-24 DIAGNOSIS — R29898 Other symptoms and signs involving the musculoskeletal system: Secondary | ICD-10-CM | POA: Diagnosis not present

## 2022-01-24 DIAGNOSIS — M25641 Stiffness of right hand, not elsewhere classified: Secondary | ICD-10-CM | POA: Diagnosis not present

## 2022-01-28 LAB — CBC WITH DIFFERENTIAL/PLATELET
Basophils Absolute: 0 10*3/uL (ref 0.0–0.2)
Basos: 1 %
EOS (ABSOLUTE): 0.1 10*3/uL (ref 0.0–0.4)
Eos: 2 %
Hematocrit: 47 % (ref 37.5–51.0)
Hemoglobin: 16 g/dL (ref 13.0–17.7)
Immature Grans (Abs): 0 10*3/uL (ref 0.0–0.1)
Immature Granulocytes: 0 %
Lymphocytes Absolute: 1.9 10*3/uL (ref 0.7–3.1)
Lymphs: 31 %
MCH: 31.7 pg (ref 26.6–33.0)
MCHC: 34 g/dL (ref 31.5–35.7)
MCV: 93 fL (ref 79–97)
Monocytes Absolute: 0.4 10*3/uL (ref 0.1–0.9)
Monocytes: 6 %
Neutrophils Absolute: 3.7 10*3/uL (ref 1.4–7.0)
Neutrophils: 60 %
Platelets: 149 10*3/uL — ABNORMAL LOW (ref 150–450)
RBC: 5.04 x10E6/uL (ref 4.14–5.80)
RDW: 12.8 % (ref 11.6–15.4)
WBC: 6.1 10*3/uL (ref 3.4–10.8)

## 2022-01-28 LAB — COMPREHENSIVE METABOLIC PANEL
ALT: 32 IU/L (ref 0–44)
AST: 33 IU/L (ref 0–40)
Albumin/Globulin Ratio: 1.9 (ref 1.2–2.2)
Albumin: 4.6 g/dL (ref 4.1–5.1)
Alkaline Phosphatase: 84 IU/L (ref 44–121)
BUN/Creatinine Ratio: 12 (ref 9–20)
BUN: 11 mg/dL (ref 6–20)
Bilirubin Total: 0.6 mg/dL (ref 0.0–1.2)
CO2: 25 mmol/L (ref 20–29)
Calcium: 10.1 mg/dL (ref 8.7–10.2)
Chloride: 103 mmol/L (ref 96–106)
Creatinine, Ser: 0.91 mg/dL (ref 0.76–1.27)
Globulin, Total: 2.4 g/dL (ref 1.5–4.5)
Glucose: 90 mg/dL (ref 70–99)
Potassium: 4.7 mmol/L (ref 3.5–5.2)
Sodium: 143 mmol/L (ref 134–144)
Total Protein: 7 g/dL (ref 6.0–8.5)
eGFR: 111 mL/min/{1.73_m2} (ref >59)

## 2022-01-28 LAB — LIPID PANEL WITH LDL/HDL RATIO
Cholesterol, Total: 157 mg/dL (ref 100–199)
HDL: 35 mg/dL — ABNORMAL LOW (ref >39)
LDL Chol Calc (NIH): 90 mg/dL (ref 0–99)
LDL/HDL Ratio: 2.6 ratio (ref 0.0–3.6)
Triglycerides: 186 mg/dL — ABNORMAL HIGH (ref 0–149)
VLDL Cholesterol Cal: 32 mg/dL (ref 5–40)

## 2022-01-28 LAB — TESTOSTERONE,FREE AND TOTAL
Testosterone, Free: 10.7 pg/mL (ref 8.7–25.1)
Testosterone: 479 ng/dL (ref 264–916)

## 2022-08-08 IMAGING — CR DG FINGER INDEX 2+V*R*
3 series · 3 of 3 positions shown · non-contrast
Comparison: None.

CLINICAL DATA: Flexion deformity right index finger

EXAM:
RIGHT INDEX FINGER 2+V

[finger ap]
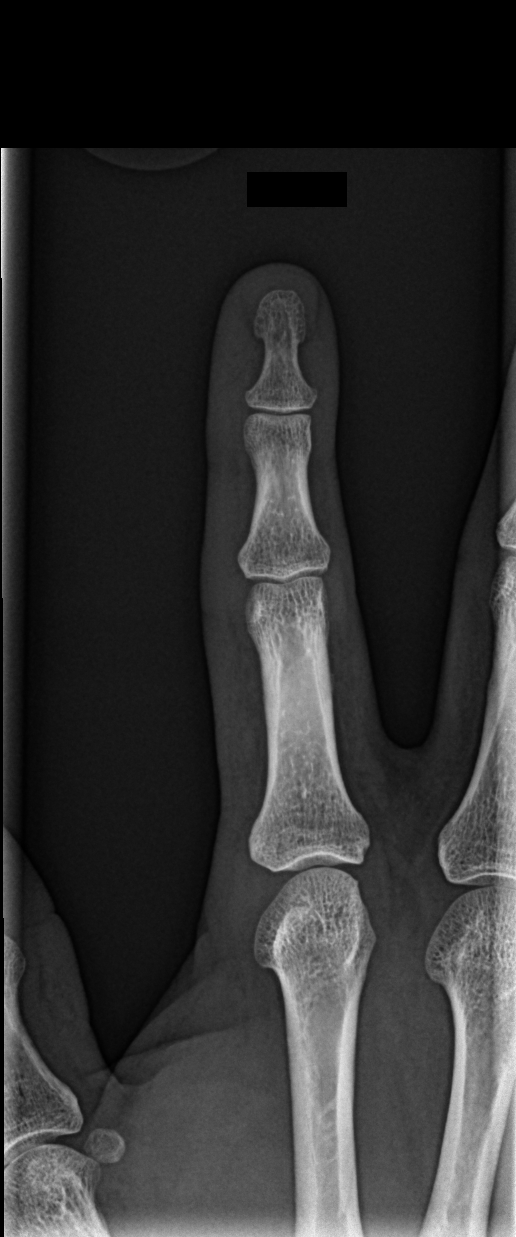

[finger obl]
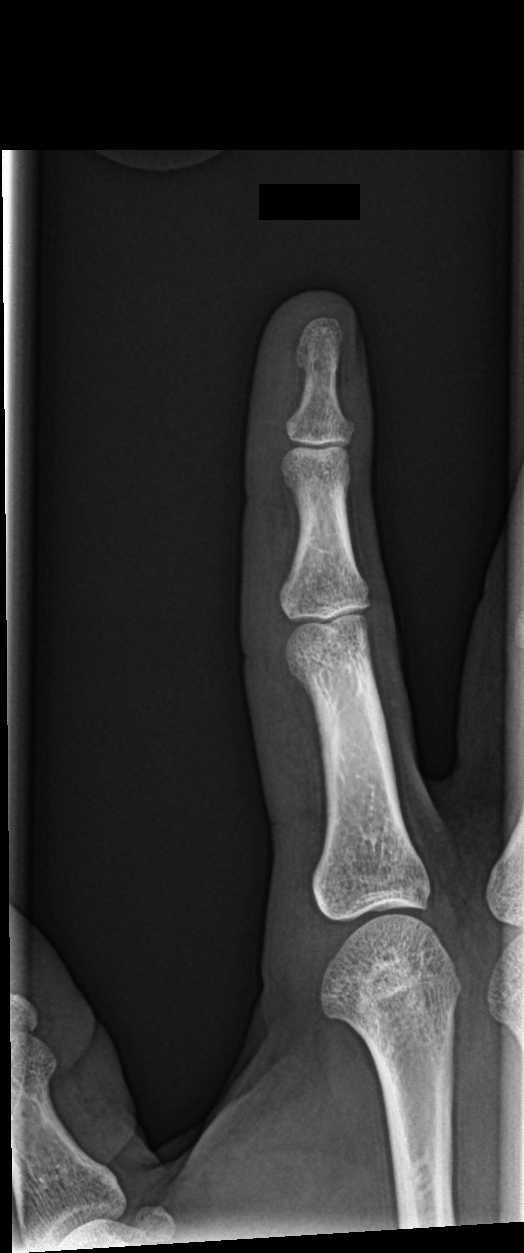

[finger lat]
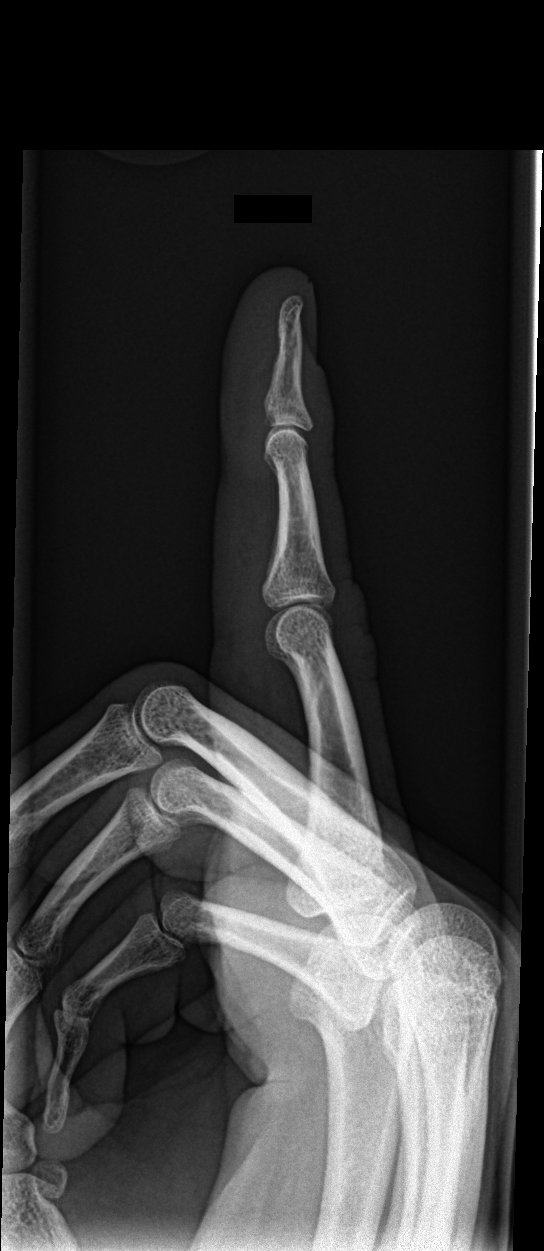

[3 of 3 positions shown; findings below may reference images not displayed]

FINDINGS: There is no evidence of fracture or dislocation. There is no
evidence of arthropathy or other focal bone abnormality. Soft
tissues are unremarkable.
IMPRESSION: Negative.

## 2023-01-24 ENCOUNTER — Ambulatory Visit: Payer: BC Managed Care – PPO | Admitting: Family Medicine

## 2023-01-24 ENCOUNTER — Encounter: Payer: Self-pay | Admitting: Family Medicine

## 2023-01-24 VITALS — BP 124/72 | HR 69 | Ht 74.0 in | Wt 235.0 lb

## 2023-01-24 DIAGNOSIS — Z0000 Encounter for general adult medical examination without abnormal findings: Secondary | ICD-10-CM | POA: Diagnosis not present

## 2023-01-24 LAB — HEMOCCULT GUIAC POC 1CARD (OFFICE): Fecal Occult Blood, POC: NEGATIVE

## 2023-01-24 NOTE — Progress Notes (Signed)
Date:  01/24/2023   Name:  Ralph Ashley   DOB:  01-10-1986   MRN:  [id number]   Chief Complaint: Annual Exam  Patient is a 37 year old male who presents for a comprehensive physical exam. The patient reports the following problems: none. Health maintenance has been reviewed up to date.      Lab Results  Component Value Date   NA 143 01/21/2022   K 4.7 01/21/2022   CO2 25 01/21/2022   GLUCOSE 90 01/21/2022   BUN 11 01/21/2022   CREATININE 0.91 01/21/2022   CALCIUM 10.1 01/21/2022   EGFR 111 01/21/2022   GFRNONAA 113 01/16/2020   Lab Results  Component Value Date   CHOL 157 01/21/2022   HDL 35 (L) 01/21/2022   LDLCALC 90 01/21/2022   TRIG 186 (H) 01/21/2022   CHOLHDL 4.4 11/01/2018   No results found for: "TSH" No results found for: "HGBA1C" Lab Results  Component Value Date   WBC 6.1 01/21/2022   HGB 16.0 01/21/2022   HCT 47.0 01/21/2022   MCV 93 01/21/2022   PLT 149 (L) 01/21/2022   Lab Results  Component Value Date   ALT 32 01/21/2022   AST 33 01/21/2022   ALKPHOS 84 01/21/2022   BILITOT 0.6 01/21/2022   No results found for: "25OHVITD2", "25OHVITD3", "VD25OH"   Review of Systems  Constitutional:  Negative for chills and fever.  HENT:  Negative for drooling, ear discharge, ear pain and sore throat.   Respiratory:  Negative for cough, shortness of breath and wheezing.   Cardiovascular:  Negative for chest pain, palpitations and leg swelling.  Gastrointestinal:  Negative for abdominal pain, blood in stool, constipation, diarrhea and nausea.  Endocrine: Negative for polydipsia.  Genitourinary:  Negative for dysuria, frequency, hematuria and urgency.  Musculoskeletal:  Negative for back pain, myalgias and neck pain.  Skin:  Negative for rash.  Allergic/Immunologic: Negative for environmental allergies.  Neurological:  Negative for dizziness and headaches.  Hematological:  Does not bruise/bleed easily.  Psychiatric/Behavioral:  Negative for suicidal  ideas. The patient is not nervous/anxious.     Patient Active Problem List   Diagnosis Date Noted   Right hand weakness 01/25/2021   Annual physical exam 09/29/2015    Allergies  Allergen Reactions   Sulfa Antibiotics Other (See Comments)    Unknown    No past surgical history on file.  Social History   Tobacco Use   Smoking status: Never   Smokeless tobacco: Never  Vaping Use   Vaping status: Never Used  Substance Use Topics   Alcohol use: Yes    Alcohol/week: 0.0 standard drinks of alcohol   Drug use: Never     Medication list has been reviewed and updated.  Current Meds  Medication Sig   finasteride (PROPECIA) 1 MG tablet TAKE 1 TABLET(1 MG) BY MOUTH DAILY   sildenafil (REVATIO) 20 MG tablet Take 1 tablet (20 mg total) by mouth as needed. online       01/24/2023    8:49 AM 01/21/2022    8:37 AM 01/25/2021    8:27 AM 01/18/2021    9:38 AM  GAD 7 : Generalized Anxiety Score  Nervous, Anxious, on Edge 0 0 0 0  Control/stop worrying 0 0 0 0  Worry too much - different things 0 0 0 0  Trouble relaxing 0 0 0 0  Restless 0 0 0 0  Easily annoyed or irritable 0 0 0 0  Afraid - awful  might happen 0 0 0 0  Total GAD 7 Score 0 0 0 0  Anxiety Difficulty Not difficult at all Not difficult at all Not difficult at all Not difficult at all       01/24/2023    8:49 AM 01/21/2022    8:36 AM 01/25/2021    8:26 AM  Depression screen PHQ 2/9  Decreased Interest 0 0 0  Down, Depressed, Hopeless 0 0 0  PHQ - 2 Score 0 0 0  Altered sleeping 0 0 0  Tired, decreased energy 0 0 0  Change in appetite 0 0 0  Feeling bad or failure about yourself  0 0 0  Trouble concentrating 0 0 0  Moving slowly or fidgety/restless 0 0 0  Suicidal thoughts 0 0 0  PHQ-9 Score 0 0 0  Difficult doing work/chores Not difficult at all Not difficult at all Not difficult at all    BP Readings from Last 3 Encounters:  01/24/23 124/72  01/21/22 110/70  01/25/21 118/70    Physical Exam Vitals  and nursing note reviewed.  Constitutional:      Appearance: Normal appearance. He is well-groomed and overweight.  HENT:     Head: Normocephalic.     Jaw: There is normal jaw occlusion.     Right Ear: Hearing, tympanic membrane, ear canal and external ear normal.     Left Ear: Hearing, tympanic membrane, ear canal and external ear normal.     Nose: Nose normal. No nasal deformity.     Mouth/Throat:     Lips: Pink.     Mouth: Mucous membranes are moist.     Dentition: Normal dentition.     Tongue: No lesions.     Palate: No mass.     Pharynx: Oropharynx is clear. Uvula midline.     Tonsils: No tonsillar exudate or tonsillar abscesses.  Eyes:     General: Lids are normal. Vision grossly intact. Gaze aligned appropriately. No scleral icterus.       Right eye: No discharge.        Left eye: No discharge.     Extraocular Movements: Extraocular movements intact.     Right eye: Normal extraocular motion and no nystagmus.     Left eye: Normal extraocular motion and no nystagmus.     Conjunctiva/sclera: Conjunctivae normal.     Pupils: Pupils are equal, round, and reactive to light.  Neck:     Thyroid: No thyroid mass, thyromegaly or thyroid tenderness.     Vascular: Normal carotid pulses. No carotid bruit, hepatojugular reflux or JVD.     Trachea: Trachea and phonation normal. No tracheal deviation.  Cardiovascular:     Rate and Rhythm: Normal rate and regular rhythm.     Chest Wall: PMI is not displaced. No thrill.     Pulses: Normal pulses.          Carotid pulses are 2+ on the right side and 2+ on the left side.      Radial pulses are 2+ on the right side and 2+ on the left side.       Femoral pulses are 2+ on the right side and 2+ on the left side.      Popliteal pulses are 2+ on the right side and 2+ on the left side.       Dorsalis pedis pulses are 2+ on the right side and 2+ on the left side.       Posterior tibial pulses are  2+ on the right side and 2+ on the left side.      Heart sounds: Normal heart sounds, S1 normal and S2 normal. No murmur heard.    No systolic murmur is present.     No diastolic murmur is present.     No friction rub. No gallop. No S3 or S4 sounds.     Comments: ankle Pulmonary:     Effort: No respiratory distress.     Breath sounds: Normal breath sounds. No decreased breath sounds, wheezing, rhonchi or rales.  Chest:  Breasts:    Breasts are symmetrical.     Right: Normal.     Left: Normal.  Abdominal:     General: Bowel sounds are normal.     Palpations: Abdomen is soft. There is no hepatomegaly, splenomegaly or mass.     Tenderness: There is no abdominal tenderness. There is no guarding or rebound.     Hernia: There is no hernia in the umbilical area.  Genitourinary:    Penis: Normal.      Testes: Normal.     Epididymis:     Right: Normal.     Left: Normal.     Prostate: Normal. Not enlarged, not tender and no nodules present.     Rectum: Normal. Guaiac result negative. No mass.  Musculoskeletal:        General: No tenderness. Normal range of motion.     Cervical back: Normal, normal range of motion and neck supple.     Thoracic back: Normal.     Lumbar back: Normal.     Right lower leg: 1+ Edema present.     Left lower leg: 1+ Edema present.  Lymphadenopathy:     Cervical: No cervical adenopathy.     Right cervical: No superficial, deep or posterior cervical adenopathy.    Left cervical: No superficial, deep or posterior cervical adenopathy.     Upper Body:     Right upper body: No supraclavicular or axillary adenopathy.     Left upper body: No supraclavicular or axillary adenopathy.  Skin:    General: Skin is warm.     Findings: No rash.          Comments: Birthmark unchanged  Neurological:     Mental Status: He is alert and oriented to person, place, and time.     Cranial Nerves: Cranial nerves 2-12 are intact. No cranial nerve deficit.     Sensory: Sensation is intact.     Motor: Motor function is intact.      Coordination: Romberg sign negative.     Deep Tendon Reflexes: Reflexes are normal and symmetric.  Psychiatric:        Behavior: Behavior is cooperative.     Wt Readings from Last 3 Encounters:  01/24/23 235 lb (106.6 kg)  01/21/22 230 lb (104.3 kg)  01/25/21 223 lb (101.2 kg)    BP 124/72   Pulse 69   Ht 6\' 2"  (1.88 m)   Wt 235 lb (106.6 kg)   SpO2 96%   BMI 30.17 kg/m   Assessment and Plan: Ralph Ashley is a 37 y.o. male who presents today for his Complete Annual Exam. He feels well. 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 He reports he is sleeping well. Immunizations are reviewed and recommendations provided.   Age appropriate screening tests are discussed. Counseling given for risk factor reduction interventions.   1. Annual physical exam No subjective/objective concerns noted during HPI, review of past medical history and  meds, review of systems, and physical exam.  Continue with labs consisting of CBC CMP and lipid panel.  Hemoccult was noted to be negative. - CBC w/Diff/Platelet - Comprehensive Metabolic Panel (CMET) - Lipid Panel With LDL/HDL Ratio - POCT Occult Blood Stool    Elizabeth Sauer, MD

## 2023-01-26 NOTE — Progress Notes (Signed)
Called pt left VM to call back.  PEC may give results if patient returns call - CRM created.  KP

## 2023-05-02 ENCOUNTER — Other Ambulatory Visit: Payer: Self-pay | Admitting: Family Medicine

## 2023-05-02 DIAGNOSIS — L649 Androgenic alopecia, unspecified: Secondary | ICD-10-CM

## 2023-05-02 NOTE — Telephone Encounter (Signed)
Medication Refill - Medication: finasteride (PROPECIA) 1 MG tablet   Pt says because he just had a CPE 01/24/2023 he should not have to come in for another appt for a medication refill. Pt is completely out of his current supply   Has the patient contacted their pharmacy? Yes.   (Agent: If no, request that the patient contact the pharmacy for the refill. If patient does not wish to contact the pharmacy document the reason why and proceed with request.) (Agent: If yes, when and what did the pharmacy advise?)  Preferred Pharmacy (with phone number or street name):  [id number], [place] - [address] AT [facility] OF [address]  [address] [place] [address]  Phone: [phone] Fax: [phone]   Has the patient been seen for an appointment in the last year OR does the patient have an upcoming appointment? Yes.    Agent: Please be advised that RX refills may take up to 3 business days. We ask that you follow-up with your pharmacy.

## 2023-05-03 ENCOUNTER — Other Ambulatory Visit: Payer: Self-pay

## 2023-05-03 MED ORDER — FINASTERIDE 1 MG PO TABS: ORAL_TABLET | ORAL | 0 refills | Status: DC

## 2023-05-03 NOTE — Telephone Encounter (Signed)
Requested medication (s) are due for refill today: yes  Requested medication (s) are on the active medication list: yes  Last refill:  01/22/23 #90 1 RF  Future visit scheduled:yes  Notes to clinic:  no PSA level   Requested Prescriptions  Pending Prescriptions Disp Refills   finasteride (PROPECIA) 1 MG tablet 90 tablet 1    Sig: TAKE 1 TABLET(1 MG) BY MOUTH DAILY     Urology: 5-alpha Reductase Inhibitors Failed - 05/02/2023  4:20 PM      Failed - PSA in normal range and within 360 days    No results found for: "LABPSA", "PSA", "PSA1", "ULTRAPSA"       Passed - Valid encounter within last 12 months    Recent Outpatient Visits           3 months ago Annual physical exam   [place] [facility]   1 year ago Annual physical exam   [facility]   2 years ago Right hand weakness   [facility], Ocie Bob, MD   2 years ago Annual physical exam   [facility] at [facility]   3 years ago Annual physical exam   [facility]       Future Appointments             In 8 months Duanne Limerick, MD [facility] at [facility], [facility]

## 2023-06-30 ENCOUNTER — Other Ambulatory Visit: Payer: Self-pay | Admitting: Family Medicine

## 2023-06-30 DIAGNOSIS — L649 Androgenic alopecia, unspecified: Secondary | ICD-10-CM

## 2023-09-01 ENCOUNTER — Other Ambulatory Visit: Payer: Self-pay | Admitting: Family Medicine

## 2023-09-01 DIAGNOSIS — L649 Androgenic alopecia, unspecified: Secondary | ICD-10-CM

## 2023-09-04 NOTE — Telephone Encounter (Signed)
 Requested Prescriptions  Pending Prescriptions Disp Refills   finasteride (PROPECIA) 1 MG tablet [Pharmacy Med Name: FINASTERIDE 1MG  TABLETS] 90 tablet 1    Sig: TAKE 1 TABLET(1 MG) BY MOUTH DAILY     Urology: 5-alpha Reductase Inhibitors Failed - 09/04/2023  9:03 AM      Failed - PSA in normal range and within 360 days    No results found for: "LABPSA", "PSA", "PSA1", "ULTRAPSA"       Passed - Valid encounter within last 12 months    Recent Outpatient Visits           7 months ago Annual physical exam   [place] [facility]   1 year ago Annual physical exam   [facility]   2 years ago Right hand weakness   [facility], Ocie Bob, MD   2 years ago Annual physical exam   [facility]   3 years ago Annual physical exam   [facility] at [facility]       Future Appointments             In 4 months Duanne Limerick, MD [facility] at [facility], [facility]

## 2024-01-26 ENCOUNTER — Encounter: Payer: Self-pay | Admitting: Family Medicine

## 2024-01-26 ENCOUNTER — Ambulatory Visit: Admitting: Family Medicine

## 2024-01-26 VITALS — BP 116/70 | HR 85 | Ht 74.0 in | Wt 229.4 lb

## 2024-01-26 DIAGNOSIS — G4733 Obstructive sleep apnea (adult) (pediatric): Secondary | ICD-10-CM | POA: Diagnosis not present

## 2024-01-26 DIAGNOSIS — R7309 Other abnormal glucose: Secondary | ICD-10-CM | POA: Diagnosis not present

## 2024-01-26 DIAGNOSIS — R29898 Other symptoms and signs involving the musculoskeletal system: Secondary | ICD-10-CM

## 2024-01-26 DIAGNOSIS — Z136 Encounter for screening for cardiovascular disorders: Secondary | ICD-10-CM | POA: Diagnosis not present

## 2024-01-26 DIAGNOSIS — Z0000 Encounter for general adult medical examination without abnormal findings: Secondary | ICD-10-CM

## 2024-01-26 DIAGNOSIS — M6790 Unspecified disorder of synovium and tendon, unspecified site: Secondary | ICD-10-CM

## 2024-01-26 DIAGNOSIS — Z1159 Encounter for screening for other viral diseases: Secondary | ICD-10-CM | POA: Diagnosis not present

## 2024-01-26 MED ORDER — TIRZEPATIDE-WEIGHT MANAGEMENT 2.5 MG/0.5ML ~~LOC~~ SOLN
2.5000 mg | SUBCUTANEOUS | 0 refills | Status: DC
Start: 2024-01-26 — End: 2024-01-26

## 2024-01-26 NOTE — Assessment & Plan Note (Signed)
 Right hand digital weakness and limited flexion - Weakness and inability to fully flex digits 2 and 3  - No associated pain - Cortisone injection provided no symptom relief - Ultrasound demonstrated no structural damage - Presence of a cyst on the hand, unclear impact on symptoms - No MRI performed

## 2024-01-26 NOTE — Assessment & Plan Note (Signed)
 Obstructive sleep apnea - Uses CPAP machine - Improved sleep quality when sleeping in an inclined position - No specialist evaluation for sleep apnea in several years

## 2024-01-26 NOTE — Assessment & Plan Note (Signed)
 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 Suspected tendon issue, no overt neurogenic involvement. - Order MRI of the right hand. - Refer to orthopedic hand specialist for further evaluation.

## 2024-01-26 NOTE — Progress Notes (Signed)
 Annual Physical Exam Visit  Patient Information:  Patient ID: Ralph Ashley, male DOB: Jan 25, 1986 Age: 38 y.o. MRN: [id number]   Subjective:   CC: Annual Physical Exam  HPI:  Ralph Ashley is here for their annual physical.  I reviewed the past medical history, family history, social history, surgical history, and allergies today and changes were made as necessary.  Please see the problem list section below for additional details.  Past Medical History: Past Medical History:  Diagnosis Date   Allergy    Sleep apnea    uses cpap   Past Surgical History: History reviewed. No pertinent surgical history. Family History: Family History  Problem Relation Age of Onset   Cancer Paternal Grandfather    Stroke Paternal Grandfather    Allergies: Allergies  Allergen Reactions   Sulfa Antibiotics Other (See Comments)    Unknown   Health Maintenance: Health Maintenance  Topic Date Due   Hepatitis C Screening  Never done   Hepatitis B Vaccines (1 of 3 - 19+ 3-dose series) Never done   HPV VACCINES (1 - 3-dose SCDM series) Never done   COVID-19 Vaccine (3 - 2024-25 season) 02/26/2023   INFLUENZA VACCINE  01/26/2024   DTaP/Tdap/Td (2 - Td or Tdap) 10/29/2026   Meningococcal B Vaccine  Aged Out   HIV Screening  Discontinued    HM Colonoscopy   This patient has no relevant Health Maintenance data.    Medications: Current Outpatient Medications on File Prior to Visit  Medication Sig Dispense Refill   Ascorbic Acid (VITAMIN C) 1000 MG tablet Take 1,000 mg by mouth daily.     finasteride  (PROPECIA ) 1 MG tablet TAKE 1 TABLET(1 MG) BY MOUTH DAILY 90 tablet 1   sildenafil  (REVATIO ) 20 MG tablet Take 1 tablet (20 mg total) by mouth as needed. online 10 tablet 5   zinc gluconate 50 MG tablet Take 50 mg by mouth daily.     No current facility-administered medications on file prior to visit.    Objective:   Vitals:   01/26/24 0836  BP: 116/70  Pulse: 85  SpO2: 96%    Vitals:   01/26/24 0836  Weight: 229 lb 6.4 oz (104.1 kg)  Height: 6' 2 (1.88 m)   Body mass index is 29.45 kg/m.  General: Well Developed, well nourished, and in no acute distress.  Neuro: Alert and oriented x3, extra-ocular muscles intact, sensation grossly intact. Cranial nerves II through XII are grossly intact, motor, sensory, and coordinative functions are intact. HEENT: Normocephalic, atraumatic, neck supple, no masses, no lymphadenopathy, thyroid nonenlarged. Oropharynx, nasopharynx, external ear canals are unremarkable. Skin: Warm and dry, no rashes noted.  Cardiac: Regular rate and rhythm, no murmurs rubs or gallops. No peripheral edema. Pulses symmetric. Respiratory: Clear to auscultation bilaterally. Speaking in full sentences.  Abdominal: Soft, nontender, nondistended, positive bowel sounds, no masses, no organomegaly. Musculoskeletal: Stable, and with full range of motion. Right Hand Exam INSPECTION: No gross abnormalities PALPATION: Nontender 2nd and 3rd MCP, radial styloid, carpometacarpal junctions RANGE OF MOTION (ROM) ASSESSMENT: Passive full, limited active flexion at digits 2 and 3 MUSCLE STRENGTH TESTING: Full 5/5 strength in extension at MCP, PIP, and DIP joints of digits 2 and 3. Resisted PIP flexion: 5-/5 strength at digits 4 and 5, 3/5 at digits 2 and 3, thumb unaffected. Resisted DIP flexion: 5/5 strength at digits 4 and 5, 4/5 at digit 3, 4-/5 at digit 2, thumb unaffected NEUROVASCULAR: Sensorimotor function intact in radial, median,  and ulnar distributions SPECIAL TESTS: Contralateral hand examination benign   Impression and Recommendations:   The patient was counselled, risk factors were discussed, and anticipatory guidance given.  Problem List Items Addressed This Visit     Deficit of flexor tendon   Right hand digital weakness and limited flexion - Weakness and inability to fully flex digits 2 and 3  - No associated pain - Cortisone injection  provided no symptom relief - Ultrasound demonstrated no structural damage - Presence of a cyst on the hand, unclear impact on symptoms - No MRI performed      Relevant Orders   Ambulatory referral to Orthopedic Surgery   MR HAND RIGHT WO CONTRAST   Healthcare maintenance - Primary   Relevant Orders   CBC   OSA on CPAP   Relevant Orders   Ambulatory referral to ENT   Right hand weakness   Digital weakness and limited flexion - Weakness and inability to fully flex right hand digits 2 and secondarily 3 - No associated pain - Cortisone injection by outside orthopedic group with concern for stenosing tenosynovitis provided no symptom relief - Ultrasound demonstrated no structural damage at outside sports medicine group - No MRI on file  Flexion deficit of right hand second and third digits Chronic flexion motor deficit in second and third digits of right hand without pain. Suspected tendon issue, no overt neurogenic involvement. - Order MRI of the right hand. - Refer to orthopedic hand specialist for further evaluation.      Relevant Orders   Ambulatory referral to Orthopedic Surgery   MR HAND RIGHT WO CONTRAST   Other Visit Diagnoses       Screening for cardiovascular condition       Relevant Orders   Lipid panel     Abnormal glucose       Relevant Orders   Comprehensive metabolic panel with GFR   Hemoglobin A1c     Screening for viral disease       Relevant Orders   Hepatitis C antibody        Orders & Medications Medications:  Meds ordered this encounter  Medications   DISCONTD: tirzepatide (ZEPBOUND) 2.5 MG/0.5ML injection vial    Sig: Inject 2.5 mg into the skin once a week.    Dispense:  2 mL    Refill:  0   Orders Placed This Encounter  Procedures   MR HAND RIGHT WO CONTRAST   CBC   Comprehensive metabolic panel with GFR   Hemoglobin A1c   Lipid panel   Hepatitis C antibody   Ambulatory referral to Orthopedic Surgery   Ambulatory referral to ENT      No follow-ups on file.    Selinda JINNY Ku, MD, [facility]   Primary Care Sports Medicine Primary Care and Sports Medicine at [facility]

## 2024-01-27 LAB — COMPREHENSIVE METABOLIC PANEL WITH GFR
ALT: 27 IU/L (ref 0–44)
AST: 29 IU/L (ref 0–40)
Albumin: 4.2 g/dL (ref 4.1–5.1)
Alkaline Phosphatase: 78 IU/L (ref 44–121)
BUN/Creatinine Ratio: 17 (ref 9–20)
BUN: 16 mg/dL (ref 6–20)
Bilirubin Total: 0.5 mg/dL (ref 0.0–1.2)
CO2: 25 mmol/L (ref 20–29)
Calcium: 9.4 mg/dL (ref 8.7–10.2)
Chloride: 106 mmol/L (ref 96–106)
Creatinine, Ser: 0.92 mg/dL (ref 0.76–1.27)
Globulin, Total: 2.3 g/dL (ref 1.5–4.5)
Glucose: 98 mg/dL (ref 70–99)
Potassium: 4.4 mmol/L (ref 3.5–5.2)
Sodium: 143 mmol/L (ref 134–144)
Total Protein: 6.5 g/dL (ref 6.0–8.5)
eGFR: 109 mL/min/1.73 (ref >59)

## 2024-01-27 LAB — CBC
Hematocrit: 48.3 % (ref 37.5–51.0)
Hemoglobin: 16 g/dL (ref 13.0–17.7)
MCH: 31.9 pg (ref 26.6–33.0)
MCHC: 33.1 g/dL (ref 31.5–35.7)
MCV: 96 fL (ref 79–97)
Platelets: 146 x10E3/uL — ABNORMAL LOW (ref 150–450)
RBC: 5.02 x10E6/uL (ref 4.14–5.80)
RDW: 12.9 % (ref 11.6–15.4)
WBC: 6.4 x10E3/uL (ref 3.4–10.8)

## 2024-01-27 LAB — LIPID PANEL
Chol/HDL Ratio: 4.5 ratio (ref 0.0–5.0)
Cholesterol, Total: 163 mg/dL (ref 100–199)
HDL: 36 mg/dL — ABNORMAL LOW (ref >39)
LDL Chol Calc (NIH): 92 mg/dL (ref 0–99)
Triglycerides: 206 mg/dL — ABNORMAL HIGH (ref 0–149)
VLDL Cholesterol Cal: 35 mg/dL (ref 5–40)

## 2024-01-27 LAB — HEMOGLOBIN A1C
Est. average glucose Bld gHb Est-mCnc: 105 mg/dL
Hgb A1c MFr Bld: 5.3 % (ref 4.8–5.6)

## 2024-01-27 LAB — HEPATITIS C ANTIBODY: Hep C Virus Ab: NONREACTIVE

## 2024-02-05 NOTE — Patient Instructions (Addendum)
-   Obtain fasting labs with orders provided (can have water or black coffee but otherwise no food or drink x 8 hours before labs) - Review information provided - Attend eye doctor annually, dentist every 6 months, work towards or maintain 30 minutes of moderate intensity physical activity at least 5 days per week, and consume a balanced diet - Return in 1 year for physical - Contact us  for any questions between now and then   Patient Plan for Post-Visit Guidance  Right Hand Flexion Deficit  - MRI of the right hand has been ordered. - Referral placed to orthopedic hand specialist for further evaluation. - Monitor for any changes in hand function.  Red Flags  - If you develop new pain, swelling, numbness, tingling, color changes, or sudden worsening of weakness in the hand, seek medical attention promptly.

## 2024-02-07 ENCOUNTER — Ambulatory Visit: Payer: Self-pay | Admitting: Family Medicine

## 2024-02-15 ENCOUNTER — Ambulatory Visit: Admitting: Orthopedic Surgery

## 2024-02-15 DIAGNOSIS — S66801A Unspecified injury of other specified muscles, fascia and tendons at wrist and hand level, right hand, initial encounter: Secondary | ICD-10-CM

## 2024-02-15 NOTE — Progress Notes (Signed)
 Ralph Ashley - 38 y.o. male MRN [id number]  Date of birth: 08-Jul-1985  Office Visit Note: Visit Date: 02/15/2024 PCP: Joshua Cathryne BROCKS, MD (Inactive) Referred by: Alvia Selinda PARAS, MD  Subjective: No chief complaint on file.  HPI: Ralph Ashley is a pleasant 38 y.o. male who presents today for evaluation of ongoing weakness and loss of motion at the index and long fingers of the right hand.  He denies any significant injury, states this has been a chronic progression over the past few years.  Does work with his hands and feels ongoing disability to use the right hand effectively.  He has tried prior cortisone injection, therapy without relief. Prior US  did not show structural abnormality.   Pertinent ROS were reviewed with the patient and found to be negative unless otherwise specified above in HPI.   Visit Reason: right 2nd and 3rd digit flexor weakness Duration of symptoms: 2 years Hand dominance: right Occupation: assembly line Diabetic: No Smoking: No Heart/Lung History:OSA Blood Thinners: aspirin  Prior Testing/EMG: x-rays Injections (Date): 2 years ago Treatments: PT- no relief Prior Surgery: none  Assessment & Plan: Visit Diagnoses:  1. Injury of flexor tendon of right hand, initial encounter     Plan: Based on his clinical examination today, there is notable weakness and loss of motion with the index and long finger of the right hand.  In particular, the right index does have good flexion at the DIP in comparison to the contralateral hand.  I discussed with him possible diagnoses, ranging from flexor tendon injury or attrition, possible pulley injury versus ongoing tenosynovitis.  Given the chronicity of this issue refractory to conservative treatments, I have recommended he undergo right hand MRI in order to better delineate anatomy for possible structural issue that may better help explain his ongoing issues. He will return to me after study is complete.  In  the meantime we discussed use of buddy taping and anti-inflammatory medication as needed.   I spent 45 minutes in the care of this patient today including review of previous documentation, imaging obtained, face-to-face time discussing all options regarding treatment and documenting the encounter.   Follow-up: No follow-ups on file.   Meds & Orders: No orders of the defined types were placed in this encounter.   Orders Placed This Encounter  Procedures   MR HAND RIGHT WO CONTRAST     Procedures: No procedures performed      Clinical History: No specialty comments available.  He reports that he has never smoked. He has never used smokeless tobacco.  Recent Labs    01/26/24 0926  HGBA1C 5.3    Objective:   Vital Signs: There were no vitals taken for this visit.  Physical Exam  Gen: Well-appearing, in no acute distress; non-toxic CV: Regular Rate. Well-perfused. Warm.  Resp: Breathing unlabored on room air; no wheezing. Psych: Fluid speech in conversation; appropriate affect; normal thought process  Ortho Exam Right hand - Index finger ROM MP 0-90, PIP 0-60, DIP 0-15 when isolated (contralateral DIP 0-45) - minimal tenderness throughout index and long finger flexor sheath - AIN/PIN/IO intact, sens intact m/r/u  Imaging: No results found.  Past Medical/Family/Surgical/Social History: Medications & Allergies reviewed per EMR, new medications updated. Patient Active Problem List   Diagnosis Date Noted   OSA on CPAP 01/26/2024   Deficit of flexor tendon 01/26/2024   Right hand weakness 01/25/2021   Healthcare maintenance 09/29/2015   Past Medical History:  Diagnosis Date  Allergy    Sleep apnea    uses cpap   Family History  Problem Relation Age of Onset   Cancer Paternal Grandfather    Stroke Paternal Grandfather    No past surgical history on file. Social History   Occupational History   Not on file  Tobacco Use   Smoking status: Never   Smokeless  tobacco: Never  Vaping Use   Vaping status: Never Used  Substance and Sexual Activity   Alcohol use: Yes    Alcohol/week: 0.0 standard drinks of alcohol   Drug use: Never   Sexual activity: Yes    Partners: Female    Bluford Sedler Estela) Arlinda, M.D. [facility], Hand Surgery

## 2024-02-17 ENCOUNTER — Other Ambulatory Visit

## 2024-02-29 DIAGNOSIS — J3489 Other specified disorders of nose and nasal sinuses: Secondary | ICD-10-CM | POA: Diagnosis not present

## 2024-02-29 DIAGNOSIS — G4733 Obstructive sleep apnea (adult) (pediatric): Secondary | ICD-10-CM | POA: Diagnosis not present

## 2024-03-01 ENCOUNTER — Encounter: Payer: Self-pay | Admitting: Physician Assistant

## 2024-03-01 ENCOUNTER — Ambulatory Visit: Admitting: Physician Assistant

## 2024-03-01 VITALS — BP 102/82 | HR 72 | Temp 97.5°F | Ht 74.0 in | Wt 230.0 lb

## 2024-03-01 DIAGNOSIS — R29898 Other symptoms and signs involving the musculoskeletal system: Secondary | ICD-10-CM | POA: Diagnosis not present

## 2024-03-01 DIAGNOSIS — G4733 Obstructive sleep apnea (adult) (pediatric): Secondary | ICD-10-CM | POA: Diagnosis not present

## 2024-03-01 DIAGNOSIS — L649 Androgenic alopecia, unspecified: Secondary | ICD-10-CM | POA: Insufficient documentation

## 2024-03-01 DIAGNOSIS — D696 Thrombocytopenia, unspecified: Secondary | ICD-10-CM | POA: Insufficient documentation

## 2024-03-01 DIAGNOSIS — J342 Deviated nasal septum: Secondary | ICD-10-CM | POA: Insufficient documentation

## 2024-03-01 NOTE — Assessment & Plan Note (Signed)
 Patient soon to restart CPAP with new machine and mask.  Historically it has been difficult for him to maintain good compliance with CPAP, citing that he actually sleeps better if he is simply in an inclined position without the machine.  I have sent him a few resources via MyChart regarding jaw, tongue, and breathing exercises that could potentially improve his sleep apnea.

## 2024-03-01 NOTE — Progress Notes (Signed)
 Date:  03/01/2024   Name:  Ralph Ashley   DOB:  16-Oct-1985   MRN:  [id number]   Chief Complaint: Medical Management of Chronic Issues  HPI Miliano is a pleasant 38 year old male with a history of OSA on CPAP, deviated septum, and right hand weakness who presents new to me today as a transfer of care from my recently retired Animator Dr. Cathryne Molt.  He recently completed annual physical exam with routine labs 1 month ago with my colleague Dr. Selinda Ku, however Dr. Ku is not currently accepting new primary care patients.  He takes a combination sildenafil -tadalafil though as prescribed by online provider for ED.  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  Has upcoming MRI of the right hand 03/16/2024 and is presently managed by orthopedic surgery for this.  Medication list has been reviewed and updated.  Current Meds  Medication Sig   Ascorbic Acid (VITAMIN C) 1000 MG tablet Take 1,000 mg by mouth daily.   ASHWAGANDHA PO Take by mouth.   finasteride  (PROPECIA ) 1 MG tablet TAKE 1 TABLET(1 MG) BY MOUTH DAILY   UNABLE TO FIND Take 1 tablet by mouth as needed. Sildenafil -tadalafil 55 mg-22 mg   zinc gluconate 50 MG tablet Take 50 mg by mouth daily.   [DISCONTINUED] sildenafil  (REVATIO ) 20 MG tablet Take 1 tablet (20 mg total) by mouth as needed. 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 No pertinent surgical history.  Social History    Tobacco Use   Smoking status: Never   Smokeless tobacco: Never  Vaping Use   Vaping status: Never Used  Substance Use Topics   Alcohol use: Yes    Comment: 1-2 times a month   Drug use: Never    Family History  Problem Relation Age of Onset   Cancer Paternal Grandfather    Stroke Paternal Grandfather         03/01/2024    9:16 AM 01/26/2024    8:40 AM 01/24/2023    8:49 AM 01/21/2022    8:37 AM  GAD 7 : Generalized Anxiety Score  Nervous, Anxious, on Edge 0 0 0 0  Control/stop worrying 0 0 0 0  Worry too much - different things 0 0 0 0  Trouble relaxing 0 0 0 0  Restless 0 0 0 0  Easily annoyed or irritable 0 0 0 0  Afraid - awful might happen 0 0 0 0  Total GAD 7 Score 0 0 0 0  Anxiety Difficulty Not difficult at all Not difficult at all Not difficult at all Not difficult at all       03/01/2024    9:16 AM 01/26/2024    8:40 AM 01/24/2023    8:49 AM  Depression screen PHQ 2/9  Decreased Interest 0 0 0  Down, Depressed, Hopeless 0 0 0  PHQ - 2 Score 0 0 0  Altered sleeping  0 0  Tired, decreased energy  0 0  Change in appetite  0 0  Feeling bad or failure about yourself   0 0  Trouble concentrating  0 0  Moving slowly or fidgety/restless  0 0  Suicidal thoughts  0 0  PHQ-9 Score  0 0  Difficult doing work/chores  Not difficult at all Not difficult at all    BP Readings from Last 3 Encounters:  03/01/24 102/82  01/26/24 116/70  01/24/23 124/72    Wt Readings from Last 3 Encounters:  03/01/24 230 lb (104.3 kg)  01/26/24 229 lb 6.4 oz (104.1 kg)  01/24/23 235 lb (106.6 kg)    BP 102/82   Pulse 72   Temp (!) 97.5 F (36.4 C)   Ht 6' 2 (1.88 m)   Wt 230 lb (104.3 kg)   SpO2 98%   BMI 29.53 kg/m   Physical Exam Vitals and nursing note reviewed.  Constitutional:      Appearance: Normal appearance.  Neck:     Vascular: No carotid bruit.  Cardiovascular:     Rate and Rhythm: Normal rate and regular rhythm.     Heart sounds: No murmur heard.    No  friction rub. No gallop.  Pulmonary:     Effort: Pulmonary effort is normal.     Breath sounds: Normal breath sounds.  Abdominal:     General: There is no distension.  Musculoskeletal:        General: Normal range of motion.  Skin:    General: Skin is warm and dry.  Neurological:     Mental Status: He is alert and oriented to person, place, and time.     Gait: Gait is intact.  Psychiatric:        Mood and Affect: Mood and affect normal.     Recent Labs     Component Value Date/Time   NA 143 01/26/2024 0926   K 4.4 01/26/2024 0926   CL 106 01/26/2024 0926   CO2 25 01/26/2024 0926   GLUCOSE 98 01/26/2024 0926   BUN 16 01/26/2024 0926   CREATININE 0.92 01/26/2024 0926   CALCIUM 9.4 01/26/2024 0926   PROT 6.5 01/26/2024 0926   ALBUMIN 4.2 01/26/2024 0926   AST 29 01/26/2024 0926   ALT 27 01/26/2024 0926   ALKPHOS 78 01/26/2024 0926   BILITOT 0.5 01/26/2024 0926   GFRNONAA 113 01/16/2020 1121   GFRAA 131 01/16/2020 1121    Lab Results  Component Value Date   WBC 6.4 01/26/2024   HGB 16.0 01/26/2024   HCT 48.3 01/26/2024   MCV 96 01/26/2024   PLT 146 (L) 01/26/2024   Lab Results  Component Value Date   HGBA1C 5.3 01/26/2024   Lab Results  Component Value Date   CHOL 163 01/26/2024   HDL 36 (L) 01/26/2024   LDLCALC 92 01/26/2024   TRIG 206 (H) 01/26/2024   CHOLHDL 4.5 01/26/2024   No results found for: TSH    Assessment and Plan:  OSA on CPAP Assessment & Plan: Patient soon to restart CPAP with new machine and mask.  Historically it has been difficult for him to maintain good compliance with CPAP, citing that he actually sleeps better if he is simply in an inclined position without the machine.  I have sent him a few resources via MyChart regarding jaw, tongue, and breathing exercises that could potentially improve his sleep apnea.   Deviated nasal septum Assessment & Plan: Per patient, ENT does not feel that  septoplasty would be particularly  beneficial for his OSA given that he is a back sleeper.  He has used Nasacort in the past which helps slightly; may continue this as desired.   Right hand weakness Assessment & Plan: Will be on the look out for upcoming results from MRI later this month.      Return in about 11 months (around 01/29/2025) for FASTING CPE.    Rolan Hoyle, PA-C, DMSc, Nutritionist [facility] Primary Care and Sports Medicine [facility] [phone]

## 2024-03-01 NOTE — Assessment & Plan Note (Signed)
 Will be on the look out for upcoming results from MRI later this month.

## 2024-03-01 NOTE — Assessment & Plan Note (Signed)
 Per patient, ENT does not feel that septoplasty would be particularly beneficial for his OSA given that he is a back sleeper.  He has used Nasacort in the past which helps slightly; may continue this as desired.

## 2024-03-16 ENCOUNTER — Other Ambulatory Visit

## 2024-03-30 ENCOUNTER — Ambulatory Visit
Admission: RE | Admit: 2024-03-30 | Discharge: 2024-03-30 | Disposition: A | Discharge status: Home / Self Care | Source: Ambulatory Visit | Attending: Orthopedic Surgery | Admitting: Orthopedic Surgery

## 2024-03-30 DIAGNOSIS — S63610A Unspecified sprain of right index finger, initial encounter: Secondary | ICD-10-CM | POA: Diagnosis not present

## 2024-03-30 DIAGNOSIS — S66801A Unspecified injury of other specified muscles, fascia and tendons at wrist and hand level, right hand, initial encounter: Secondary | ICD-10-CM

## 2024-04-11 ENCOUNTER — Telehealth: Payer: Self-pay | Admitting: Orthopedic Surgery

## 2024-04-11 NOTE — Telephone Encounter (Signed)
 Pt called stating that he has received his MRI results in My [facility] and wants to know what the next steps are. Call back number [phone]

## 2024-04-12 DIAGNOSIS — J0191 Acute recurrent sinusitis, unspecified: Secondary | ICD-10-CM | POA: Diagnosis not present

## 2024-04-13 ENCOUNTER — Telehealth: Admitting: Physician Assistant

## 2024-04-13 DIAGNOSIS — J019 Acute sinusitis, unspecified: Secondary | ICD-10-CM | POA: Diagnosis not present

## 2024-04-13 DIAGNOSIS — B9689 Other specified bacterial agents as the cause of diseases classified elsewhere: Secondary | ICD-10-CM

## 2024-04-13 MED ORDER — AMOXICILLIN-POT CLAVULANATE 875-125 MG PO TABS: 1.0000 | ORAL_TABLET | Freq: Two times a day (BID) | ORAL | 0 refills | Status: AC

## 2024-04-13 NOTE — Progress Notes (Signed)

## 2024-04-29 ENCOUNTER — Encounter: Payer: Self-pay | Admitting: Radiology

## 2024-05-06 DIAGNOSIS — G4733 Obstructive sleep apnea (adult) (pediatric): Secondary | ICD-10-CM | POA: Diagnosis not present

## 2024-05-07 NOTE — Progress Notes (Unsigned)
 Ralph Ashley - 38 y.o. male MRN [id number]  Date of birth: 1985/08/21  Office Visit Note: Visit Date: 05/08/2024 PCP: Manya Toribio SQUIBB, PA Referred by: Manya Toribio SQUIBB, PA  Subjective: No chief complaint on file.  HPI: Ralph Ashley is a pleasant 38 y.o. male who returns today for evaluation of ongoing weakness and loss of motion at the index and long fingers of the right hand.  He denies any significant injury, states this has been a chronic progression over the past few years.  Does work with his hands and feels ongoing disability to use the right hand effectively.  He has tried prior cortisone injection, therapy without relief. Prior US  did not show structural abnormality.   Underwent recent MRI study as well with right hand without significant findings.  Pertinent ROS were reviewed with the patient and found to be negative unless otherwise specified above in HPI.    Assessment & Plan: Visit Diagnoses:  1. Injury of flexor tendon of right hand, initial encounter     Plan: Based on his clinical examination today, there remains notable weakness and loss of motion with the index and long finger of the right hand.  In particular, the right index does have good flexion at the DIP in comparison to the contralateral hand.  I discussed with him possible diagnoses, ranging from flexor tendon injury or attrition, possible pulley injury versus ongoing tenosynovitis.  However, his MRI study did not show any significant findings.    Given the chronicity of this issue refractory to conservative treatments, I have recommended he undergo right upper extremity electrodiagnostic study in order to better delineate potential nerve compression pathology that may be creating the symptoms.  There is no obvious structural abnormalities on his MRI at this juncture that would warrant surgical fixation.  He expressed understanding, will undergo EMG of the right upper extremity as instructed and  return to me after results are obtained to review and discuss appropriate next treatment steps.  Follow-up: No follow-ups on file.   Meds & Orders: No orders of the defined types were placed in this encounter.   Orders Placed This Encounter  Procedures   Ambulatory referral to Physical Medicine Rehab     Procedures: No procedures performed      Clinical History: No specialty comments available.  He reports that he has never smoked. He has never used smokeless tobacco.  Recent Labs    01/26/24 0926  HGBA1C 5.3    Objective:   Vital Signs: There were no vitals taken for this visit.  Physical Exam  Gen: Well-appearing, in no acute distress; non-toxic CV: Regular Rate. Well-perfused. Warm.  Resp: Breathing unlabored on room air; no wheezing. Psych: Fluid speech in conversation; appropriate affect; normal thought process  Ortho Exam Right hand - Index finger ROM MP 0-90, PIP 0-60, DIP 0-15 when isolated (contralateral DIP 0-45) - minimal tenderness throughout index and long finger flexor sheath - AIN/PIN/IO intact, sens intact m/r/u  Imaging: No results found.  Past Medical/Family/Surgical/Social History: Medications & Allergies reviewed per EMR, new medications updated. 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 Social History   Occupational History   Not on file  Tobacco Use   Smoking status: Never   Smokeless tobacco: Never  Vaping Use   Vaping status: Never Used  Substance and Sexual Activity   Alcohol  use: Yes    Comment: 1-2 times a month   Drug use: Never   Sexual activity: Yes    Partners: Female    Obera Stauch Estela) Diem Pagnotta, M.D. [facility], Hand Surgery

## 2024-05-08 ENCOUNTER — Ambulatory Visit: Admitting: Orthopedic Surgery

## 2024-05-08 DIAGNOSIS — S66801A Unspecified injury of other specified muscles, fascia and tendons at wrist and hand level, right hand, initial encounter: Secondary | ICD-10-CM | POA: Diagnosis not present

## 2024-06-11 ENCOUNTER — Other Ambulatory Visit: Payer: Self-pay | Admitting: Orthopedic Surgery

## 2024-06-11 ENCOUNTER — Ambulatory Visit: Admitting: Physical Medicine and Rehabilitation

## 2024-06-11 DIAGNOSIS — R202 Paresthesia of skin: Secondary | ICD-10-CM

## 2024-06-11 DIAGNOSIS — S66801A Unspecified injury of other specified muscles, fascia and tendons at wrist and hand level, right hand, initial encounter: Secondary | ICD-10-CM

## 2024-06-11 DIAGNOSIS — R29898 Other symptoms and signs involving the musculoskeletal system: Secondary | ICD-10-CM

## 2024-06-11 NOTE — Progress Notes (Unsigned)
 Pain Scale   Average Pain 0 Patient advising he is unable to bend his index finger and the one next to it. Patient is Right hand dominate        +Driver, -BT, -Dye Allergies.

## 2024-06-11 NOTE — Progress Notes (Unsigned)
 Ralph Ashley - 38 y.o. male MRN [id number]  Date of birth: February 16, 1986  Office Visit Note: Visit Date: 06/11/2024 PCP: Manya Toribio SQUIBB, PA Referred by: Arlinda Buster, MD  Subjective: No chief complaint on file.  HPI:  Ralph Ashley is a 38 y.o. male who comes in today at the request of Dr. Anshul Agarwala for evaluation and management of chronic, worsening weakness in the Right upper extremities mostly index finger and long finger.  Patient is Right hand dominant.  male who returns today for evaluation of ongoing weakness and loss of motion at the index and long fingers of the right hand.  He denies any significant injury, states this has been a chronic progression over the past few years.  Does work with his hands and feels ongoing disability to use the right hand effectively.  He has tried prior cortisone injection, therapy without relief. Prior US  did not show structural abnormality.    Underwent recent MRI study as well with right hand without significant findings.  [facility], clinic today for follow up evaluation and management of decreased right index finger range of motion suspected to be due to a trigger finger. They were last evaluated by Ozell Flake, MD on 03/29/2021. At that time, the plan was to perform a trigger finger steroid injection. He denies any significant change to his symptoms and so he follows up today for reevaluation.  At the time of this visit, I reviewed his most recent evaluation by his PCP for annual wellness exam he was still noted to have difficulty with flexion at the DIP and PIP of the right index finger. He has had treatment with occupational therapy in 2020 for the same issue. He also had consultation by his PCP office with their sports medicine specialist on 01/25/2021 where there was concern for flexor tendon dysfunction so an MRI was ordered. This was denied by the patient's insurance prior to consultation with Dr. Flake.  Right  Hand/UE HPI The patient reports that the symptoms began gradually without injury 2 years ago. Primary symptoms include weakness. He reports the pain is present in his right hand and right index finger with the severity being mild. On a scale of 0 (no pain) to 10 (worst pain ever), his pain is at a 0/10 and the pain is Since the problem started it is about the same as when symptoms started. His symptoms do not wake him from sleep. He has previously had the following imaging studies performed for this problem: X-rays.   He denies any previous accident, injury, trauma to his finger. He simply noted decreased mobility and strength. He currently rates his pain severity a 0/10. He has done previous occupational therapy in 2020.         ROS Otherwise per HPI.  Assessment & Plan: Visit Diagnoses:    ICD-10-CM   1. Paresthesia of skin  R20.2 NCV with EMG (electromyography)      Plan: No additional findings.   Meds & Orders: No orders of the defined types were placed in this encounter.   Orders Placed This Encounter  Procedures   NCV with EMG (electromyography)    Follow-up: No follow-ups on file.   Procedures: No procedures performed      Clinical History: No specialty comments available.     Objective:  VS:  HT:    WT:   BMI:     BP:   HR: bpm  TEMP: ( )  RESP:  Physical  Exam   Imaging: No results found.

## 2024-06-12 ENCOUNTER — Encounter: Payer: Self-pay | Admitting: Neurology

## 2024-06-17 ENCOUNTER — Encounter: Payer: Self-pay | Admitting: Physical Medicine and Rehabilitation

## 2024-06-17 NOTE — Procedures (Signed)
 EMG & NCV Findings: All nerve conduction studies (as indicated in the following tables) were within normal limits.    Needle evaluation of the right abductor pollicis brevis, the right first dorsal interosseous, the right pronator teres, the right biceps, the left biceps, the left pronator teres, the left first dorsal interosseous, the right flexor digitorum profundus, and the right triceps muscles showed increased insertional activity and widespread spontaneous activity.  The right deltoid muscle showed increased insertional activity, moderately increased spontaneous activity, and diminished recruitment.    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  There is no significant electrodiagnostic evidence of any other focal nerve entrapment or generalized peripheral neuropathy.   Recommendations: 1.  Follow-up with referring physician.  Neurological consultation. 2.  Continue current management of symptoms.  ___________________________ Prentice Masters FAAPMR Board Certified, American Board of Physical Medicine and Rehabilitation    Nerve Conduction Studies Anti Sensory Summary Table   Stim Site NR Peak (ms) Norm Peak (ms) P-T Amp (V) Norm P-T Amp Site1 Site2 Delta-P (ms) Dist (cm) Vel (m/s) Norm Vel (m/s)  Right Median Acr Palm Anti Sensory (2nd Digit)  29.5C  Wrist    3.0 <3.6 33.0 >10 Wrist Palm 1.0 0.0    Palm    2.0 <2.0 40.7         Right Radial Anti Sensory (Base 1st Digit)  30C  Site 2    2.3  53.7         Right Ulnar Anti Sensory (5th Digit)  30.2C  Wrist    3.2 <3.7 34.4 >15.0 Wrist 5th Digit 3.2 14.0 44 >38   Motor Summary Table   Stim Site NR Onset (ms) Norm Onset (ms) O-P Amp (mV) Norm O-P Amp Site1 Site2 Delta-0 (ms) Dist (cm) Vel (m/s) Norm Vel (m/s)  Right Median Motor (Abd Poll Brev)  30.4C  Wrist    3.1 <4.2 6.2 >5 Elbow  Wrist 4.2 24.0 57 >50  Elbow    7.3  5.7         Right Ulnar Motor (Abd Dig Min)  30.6C  Wrist    3.2 <4.2 9.0 >3 B Elbow Wrist 3.8 24.0 63 >53  B Elbow    7.0  9.1  A Elbow B Elbow 1.4 10.0 71 >53  A Elbow    8.4  8.9          EMG   Side Muscle Nerve Root Ins Act Fibs Psw Amp Dur Poly Recrt Comment  Right Abd Poll Brev Median C8-T1 *CRD *4+ *4+ Nml Nml 0 *Reduced Myotonic discharges  Right 1stDorInt Ulnar C8-T1 *CRD *4+ *4+ Nml Nml 0 *Reduced Myotonic discharges  Right PronatorTeres Median C6-7 *CRD *4+ *4+ Nml Nml 0 *Reduced Myotonic discharges  Right Biceps Musculocut C5-6 *CRD *4+ *4+ Nml Nml 0 *Reduced Myotonic discharges  Right Deltoid Axillary C5-6 *CRD *2+ *2+ Nml Nml 0 *Reduced Myotonic discharges  Left Biceps Musculocut C5-6 *CRD *4+ *4+ Nml Nml 0 *Reduced Myotonic discharges  Left PronatorTeres Median C6-7 *CRD *4+ *4+ Nml Nml 0 *Reduced Myotonic discharges  Left 1stDorInt Ulnar C8-T1 *CRD *4+ *4+ Nml Nml 0 *Reduced Myotonic discharges  Right FlexDigProf Ulnar C8,T1 *CRD *4+ *4+ Nml Nml 0 *Reduced Myotonic discharges  Right Triceps Radial C6-7-8 *CRD *4+ *4+ Nml Nml 0 *Reduced Myotonic discharges    Nerve Conduction Studies Anti Sensory Left/Right Comparison   Stim Site  L Lat (ms) R Lat (ms) L-R Lat (ms) L Amp (V) R Amp (V) L-R Amp (%) Site1 Site2 L Vel (m/s) R Vel (m/s) L-R Vel (m/s)  Median Acr Palm Anti Sensory (2nd Digit)  29.5C  Wrist  3.0   33.0  Wrist Palm     Palm  2.0   40.7        Radial Anti Sensory (Base 1st Digit)  30C  Site 2  2.3   53.7        Ulnar Anti Sensory (5th Digit)  30.2C  Wrist  3.2   34.4  Wrist 5th Digit  44    Motor Left/Right Comparison   Stim Site L Lat (ms) R Lat (ms) L-R Lat (ms) L Amp (mV) R Amp (mV) L-R Amp (%) Site1 Site2 L Vel (m/s) R Vel (m/s) L-R Vel (m/s)  Median Motor (Abd Poll Brev)  30.4C  Wrist  3.1   6.2  Elbow Wrist  57   Elbow  7.3   5.7        Ulnar Motor (Abd Dig Min)  30.6C  Wrist  3.2   9.0  B Elbow Wrist   63   B Elbow  7.0   9.1  A Elbow B Elbow  71   A Elbow  8.4   8.9           Waveforms:

## 2024-06-24 ENCOUNTER — Other Ambulatory Visit: Payer: Self-pay | Admitting: Physician Assistant

## 2024-06-24 DIAGNOSIS — L649 Androgenic alopecia, unspecified: Secondary | ICD-10-CM

## 2024-06-24 NOTE — Telephone Encounter (Unsigned)
 Copied from CRM [id number]. Topic: Clinical - Medication Refill >> Jun 24, 2024  9:52 AM Tinnie C wrote: Medication: finasteride  (PROPECIA ) 1 MG tablet *Pt is completely out*  Has the patient contacted their pharmacy? Yes They say their requests were denied.   This is the patient's preferred pharmacy:  [id number], [place] - [address] AT [facility] [address] [facility] [place] [address] Phone: [phone] Fax: [phone]  Is this the correct pharmacy for this prescription? Yes If no, delete pharmacy and type the correct one.   Has the prescription been filled recently? Yes  Is the patient out of the medication? Yes  Has the patient been seen for an appointment in the last year OR does the patient have an upcoming appointment? Yes, next cpe 01/29/25  Can we respond through MyChart? Yes  Agent: Please be advised that Rx refills may take up to 3 business days. We ask that you follow-up with your pharmacy.

## 2024-06-25 ENCOUNTER — Ambulatory Visit: Admitting: Orthopedic Surgery

## 2024-06-25 MED ORDER — FINASTERIDE 1 MG PO TABS: ORAL_TABLET | ORAL | 1 refills | Status: AC

## 2024-06-25 NOTE — Telephone Encounter (Signed)
 Requested Prescriptions  Pending Prescriptions Disp Refills   finasteride  (PROPECIA ) 1 MG tablet 90 tablet 1    Sig: TAKE 1 TABLET(1 MG) BY MOUTH DAILY     Urology: 5-alpha Reductase Inhibitors Failed - 06/25/2024  3:23 PM      Failed - PSA in normal range and within 360 days    No results found for: LABPSA, PSA, PSA1, ULTRAPSA       Passed - Valid encounter within last 12 months    Recent Outpatient Visits           3 months ago OSA on CPAP   [facility], Toribio SQUIBB, PA   5 months ago Healthcare maintenance   [facility] at [facility], Selinda PARAS, MD       Future Appointments             In 2 months Hill, Venetia CROME, MD [facility]   In 7 months Manya, Toribio SQUIBB, [place] [facility] at [facility], [phone] [place]

## 2024-09-20 ENCOUNTER — Ambulatory Visit: Payer: Self-pay | Admitting: Neurology

## 2025-01-29 ENCOUNTER — Encounter: Admitting: Physician Assistant
